# Patient Record
Sex: Male | Born: 1995 | Race: Black or African American | Hispanic: No | Marital: Single | Smoking: Current some day smoker
Health system: Southern US, Community
[De-identification: ages and names within clinical notes are randomized; demographics above are authoritative.]

## PROBLEM LIST (undated history)

## (undated) ENCOUNTER — Ambulatory Visit: Payer: Self-pay

## (undated) DIAGNOSIS — F341 Dysthymic disorder: Secondary | ICD-10-CM

## (undated) HISTORY — DX: Dysthymic disorder: F34.1

---
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
## 2017-12-22 ENCOUNTER — Ambulatory Visit (HOSPITAL_COMMUNITY)
Admission: EM | Admit: 2017-12-22 | Discharge: 2017-12-22 | Disposition: A | Discharge status: Home / Self Care | Payer: Medicaid Other | Attending: Family Medicine | Admitting: Family Medicine

## 2017-12-22 ENCOUNTER — Encounter (HOSPITAL_COMMUNITY): Payer: Self-pay | Admitting: Emergency Medicine

## 2017-12-22 ENCOUNTER — Other Ambulatory Visit: Payer: Self-pay

## 2017-12-22 DIAGNOSIS — Z113 Encounter for screening for infections with a predominantly sexual mode of transmission: Secondary | ICD-10-CM

## 2017-12-22 DIAGNOSIS — Z202 Contact with and (suspected) exposure to infections with a predominantly sexual mode of transmission: Secondary | ICD-10-CM | POA: Diagnosis not present

## 2017-12-22 DIAGNOSIS — R1033 Periumbilical pain: Secondary | ICD-10-CM

## 2017-12-22 DIAGNOSIS — R109 Unspecified abdominal pain: Secondary | ICD-10-CM | POA: Diagnosis not present

## 2017-12-22 DIAGNOSIS — F17210 Nicotine dependence, cigarettes, uncomplicated: Secondary | ICD-10-CM | POA: Insufficient documentation

## 2017-12-22 LAB — POCT URINALYSIS DIP (DEVICE)
Bilirubin Urine: NEGATIVE
GLUCOSE, UA: NEGATIVE mg/dL
Hgb urine dipstick: NEGATIVE
Ketones, ur: NEGATIVE mg/dL
Nitrite: NEGATIVE
PH: 7.5 (ref 5.0–8.0)
PROTEIN: NEGATIVE mg/dL
Specific Gravity, Urine: 1.015 (ref 1.005–1.030)
UROBILINOGEN UA: 2 mg/dL — AB (ref 0.0–1.0)

## 2017-12-22 MED ORDER — AZITHROMYCIN 250 MG PO TABS
1000.0000 mg | ORAL_TABLET | Freq: Once | ORAL | Status: AC
  Administered 2017-12-22: 1000 mg via ORAL

## 2017-12-22 MED ORDER — RANITIDINE HCL 150 MG PO TABS: 150.0000 mg | ORAL_TABLET | Freq: Two times a day (BID) | ORAL | 0 refills | Status: DC

## 2017-12-22 MED ORDER — AZITHROMYCIN 250 MG PO TABS
ORAL_TABLET | ORAL | Status: AC
Start: 2017-12-22 — End: ?
  Filled 2017-12-22: qty 4

## 2017-12-22 NOTE — ED Provider Notes (Signed)
[facility]    CSN: [id number] Arrival date & time: 12/22/17  1420     History   Chief Complaint Chief Complaint  Patient presents with  . Exposure to STD  . Abdominal Pain    HPI Douglas Mckinney is a 22 y.o. male presenting today for evaluation of abdominal pain and exposure to chlamydia.  Patient states that his partner told him to weeks ago that she tested positive for chlamydia.  Patient denies any symptoms, denies penile discharge, dysuria, increased frequency.  Denies any rashes or lesions.  Denies any scrotal swelling or testicular pain.  He also notes that he has been having some abdominal pain for the past 6 weeks.  States that will come and go, feels like a bubbling sensation and occasionally not feeling.  Notices this more nighttime and occasionally with eating.  States it feels like a hunger pain despite not being hungry.  Denies any nausea or vomiting.  Denies any chest discomfort or shortness of breath.  Does endorse some sour sensation in his mouth.   HPI  Past Medical History:  Diagnosis Date  . 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 Passive smoke exposure 05/07/2009  . Developmental delay 06/19/2003  . Allergic rhinitis 06/19/2003  . Behavior problems 03/16/2003    History reviewed. No pertinent surgical history.     Home Medications    Prior to Admission medications   Medication Sig Start Date End Date Taking? Authorizing Provider  ranitidine (ZANTAC) 150 MG tablet Take 1 tablet (150 mg total) by mouth 2 (two) times daily for 14 days. 12/22/17 01/05/18  Jovahn Breit, Junius Creamer, PA-C    Family History History reviewed. No pertinent family history.  Social History Social History   Tobacco Use  . Smoking status: Current Some Day Smoker  . Smokeless tobacco: Never Used  Substance Use Topics  . Alcohol use: No  . Drug use: Not Currently     Allergies   Patient  has no known allergies.   Review of Systems Review of Systems  Constitutional: Negative for fever.  HENT: Negative for sore throat.   Respiratory: Negative for shortness of breath.   Cardiovascular: Negative for chest pain.  Gastrointestinal: Positive for abdominal pain. Negative for nausea and vomiting.  Genitourinary: Negative for difficulty urinating, discharge, dysuria, frequency, penile pain, penile swelling, scrotal swelling and testicular pain.  Skin: Negative for rash.  Neurological: Negative for dizziness, light-headedness and headaches.     Physical Exam Triage Vital Signs ED Triage Vitals  Enc Vitals Group     BP 12/22/17 1439 118/78     Pulse Rate 12/22/17 1439 90     Resp --      Temp 12/22/17 1439 99.2 F (37.3 C)     Temp Source 12/22/17 1439 Oral     SpO2 12/22/17 1439 99 %     Weight --      Height --      Head Circumference --      Peak Flow --      Pain Score 12/22/17 1437 0     Pain Loc --      Pain Edu? --      Excl. in GC? --    No data found.  Updated Vital Signs BP 118/78 (BP Location: Left Arm)   Pulse 90   Temp 99.2 F (37.3 C) (Oral)   SpO2 99%  Visual Acuity Right Eye Distance:   Left Eye Distance:   Bilateral Distance:    Right Eye Near:   Left Eye Near:    Bilateral Near:     Physical Exam  Constitutional: He appears well-developed and well-nourished.  HENT:  Head: Normocephalic and atraumatic.  Eyes: Conjunctivae are normal.  Neck: Neck supple.  Cardiovascular: Normal rate and regular rhythm.  No murmur heard. Pulmonary/Chest: Effort normal and breath sounds normal. No respiratory distress.  Abdominal: Soft. There is no tenderness.  Nontender to light and deep palpation  Musculoskeletal: He exhibits no edema.  Neurological: He is alert.  Skin: Skin is warm and dry.  Psychiatric: He has a normal mood and affect.  Nursing note and vitals reviewed.    UC Treatments / Results  Labs (all labs ordered are listed, but  only abnormal results are displayed) Labs Reviewed  URINE CYTOLOGY ANCILLARY ONLY    EKG None Radiology No results found.  Procedures Procedures (including critical care time)  Medications Ordered in UC Medications  azithromycin (ZITHROMAX) tablet 1,000 mg (has no administration in time range)     Initial Impression / Assessment and Plan / UC Course  I have reviewed the triage vital signs and the nursing notes.  Pertinent labs & imaging results that were available during my care of the patient were reviewed by me and considered in my medical decision making (see chart for details).     Patient with exposure to chlamydia, will go ahead and empirically treat.  Urine cytology obtained.  Abdominal pain for 6 weeks, possibly related to chlamydia versus other true abdominal causes.  Abdominal exam unremarkable.  Possibly related to reflux.  We will do a trial of Zantac for 2 weeks.  Discussed strict return precautions. Patient verbalized understanding and is agreeable with plan.   Final Clinical Impressions(s) / UC Diagnoses   Final diagnoses:  Periumbilical abdominal pain  STD exposure    ED Discharge Orders        Ordered    ranitidine (ZANTAC) 150 MG tablet  2 times daily     12/22/17 1449       Controlled Substance Prescriptions [place] Controlled Substance Registry consulted? Not Applicable   Lew DawesWieters, Laird Runnion C, [place]PA-C 12/22/17 1458

## 2017-12-22 NOTE — Discharge Instructions (Signed)
We have treated you today for chlamydia, with azithromycin. Please refrain from sexual activity for 7 days while medicine is clearing infection.  We are testing you for Gonorrhea, Chlamydia and Trichomonas. We will call you if anything is positive and let you know if you require any further treatment. Please inform partner of any positive results.  Please return if symptoms not improving with treatment, development of fever, nausea, vomiting, abdominal pain, scrotal pain.  Please monitor to see if abdominal pain improves with treatment, may also try reflux medicine- zantac twice daily for 2 weeks.   Please return if abdominal pain worsening, developing new symptoms or becoming more frequent.

## 2017-12-22 NOTE — ED Triage Notes (Signed)
Pt reports a "knot feeling" in his stomach for the last six weeks.  He states it happens after he eats or at night.    Pt was also exposed to Chlamydia, he was told this two weeks ago.  Pt is showing no signs of infection at this time.

## 2017-12-23 ENCOUNTER — Telehealth (HOSPITAL_COMMUNITY): Payer: Self-pay

## 2017-12-23 LAB — URINE CYTOLOGY ANCILLARY ONLY
Chlamydia: POSITIVE — AB
Neisseria Gonorrhea: NEGATIVE
Trichomonas: NEGATIVE

## 2017-12-23 NOTE — Telephone Encounter (Signed)
Chlamydia is positive.  This was treated at the urgent care visit with po zithromax 1g. Attempted to contact pt no answer at this time. Need to educate pt to please refrain from sexual intercourse for 7 days to give the medicine time to work.  Sexual partners need to be notified and tested/treated.  Condoms may reduce risk of reinfection.  Recheck or followup with PCP for further evaluation if symptoms are not improving.  [facility] notified

## 2017-12-25 NOTE — Progress Notes (Signed)
Attempted to reach patient x 2 

## 2018-02-25 ENCOUNTER — Encounter (HOSPITAL_COMMUNITY): Payer: Self-pay | Admitting: Emergency Medicine

## 2018-02-25 ENCOUNTER — Ambulatory Visit (HOSPITAL_COMMUNITY)
Admission: EM | Admit: 2018-02-25 | Discharge: 2018-02-25 | Disposition: A | Discharge status: Home / Self Care | Payer: Medicaid Other | Attending: Family Medicine | Admitting: Family Medicine

## 2018-02-25 DIAGNOSIS — R112 Nausea with vomiting, unspecified: Secondary | ICD-10-CM | POA: Diagnosis not present

## 2018-02-25 DIAGNOSIS — R109 Unspecified abdominal pain: Secondary | ICD-10-CM | POA: Diagnosis present

## 2018-02-25 DIAGNOSIS — F17200 Nicotine dependence, unspecified, uncomplicated: Secondary | ICD-10-CM | POA: Insufficient documentation

## 2018-02-25 DIAGNOSIS — R1084 Generalized abdominal pain: Secondary | ICD-10-CM | POA: Diagnosis not present

## 2018-02-25 LAB — POCT URINALYSIS DIP (DEVICE)
Bilirubin Urine: NEGATIVE
Glucose, UA: NEGATIVE mg/dL
HGB URINE DIPSTICK: NEGATIVE
Ketones, ur: NEGATIVE mg/dL
Leukocytes, UA: NEGATIVE
NITRITE: NEGATIVE
PROTEIN: NEGATIVE mg/dL
Specific Gravity, Urine: 1.02 (ref 1.005–1.030)
UROBILINOGEN UA: 0.2 mg/dL (ref 0.0–1.0)
pH: 7.5 (ref 5.0–8.0)

## 2018-02-25 LAB — POCT I-STAT, CHEM 8
BUN: 13 mg/dL (ref 6–20)
CALCIUM ION: 1.22 mmol/L (ref 1.15–1.40)
CREATININE: 0.9 mg/dL (ref 0.61–1.24)
Chloride: 101 mmol/L (ref 98–111)
GLUCOSE: 96 mg/dL (ref 70–99)
HCT: 43 % (ref 39.0–52.0)
Hemoglobin: 14.6 g/dL (ref 13.0–17.0)
Potassium: 4.8 mmol/L (ref 3.5–5.1)
Sodium: 141 mmol/L (ref 135–145)
TCO2: 29 mmol/L (ref 22–32)

## 2018-02-25 MED ORDER — OMEPRAZOLE 20 MG PO CPDR: 20.0000 mg | DELAYED_RELEASE_CAPSULE | Freq: Every day | ORAL | 0 refills | Status: DC

## 2018-02-25 NOTE — Discharge Instructions (Addendum)
Blood and urine testing are negative Continue to eat a well-balanced diet Drink plenty of water.  Limit sweet drinks. Pay attention to what foods are causing your symptoms. I have recommended follow-up with a gastroenterologist for the abdominal pain. Take omeprazole once a day.  This will help reduce stomach acid and heartburn We did lab testing during this visit.  If there are any abnormal findings that require change in medicine or indicate a positive result, you will be notified.  If all of your tests are normal, you will not be called.

## 2018-02-25 NOTE — ED Provider Notes (Signed)
[facility]    CSN: [id number] Arrival date & time: 02/25/18  1233     History   Chief Complaint Chief Complaint  Patient presents with  . Abdominal Pain    HPI Douglas Mckinney is a 22 y.o. male.   HPI\ Here complaining of abdominal pain since March.  He has abdominal discomfort daily.  Usually at [facility] crampy feeling in the middle of his belly around his umbilicus.  He states that he feels a gnawing pain like he is hungry, even when he is eaten.  Little nausea.  He vomited 2 days ago.  No constipation or diarrhea.  No blood in bowels.  No prior abdominal problems such as ulcers or GERD.  No problems when he was a child.  He does not smoke cigarettes, drink alcohol, or take excessive anti-inflammatory medications.  He had a trial of 2 weeks of Zantac which did not help him.  He cannot tell whether certain foods elicit this pain.  Spicy food does seem to make him somewhat worse. He has a history of coming here in April for similar complaints.  At that time he had exposure to chlamydia.  He was treated for the chlamydia with azithromycin, he was given the 2 weeks of Zantac, and told to come back as needed.  He was sent his chlamydia test results, but states he never received them. He has urinary frequency.  No dysuria or discharge.  No suprapubic pain.  No fever chills.  Uncertain whether he might have chlamydia again.  Vague about sexual encounters. (in private) He is here today with his girlfriend who is demanding answers.  She wants to know his test results.  The girlfriend has brought her mother.  I did speak with patient confidentially and he does not want his chlamydia results discussed with them.  Past Medical History:  Diagnosis Date  . 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 Passive smoke exposure 05/07/2009  . Developmental delay 06/19/2003  . Allergic rhinitis 06/19/2003  .  Behavior problems 03/16/2003    History reviewed. No pertinent surgical history.     Home Medications    Prior to Admission medications   Medication Sig Start Date End Date Taking? Authorizing Provider  omeprazole (PRILOSEC) 20 MG capsule Take 1 capsule (20 mg total) by mouth daily. 02/25/18   Eustace Moore, MD    Family History No family history on file.  Social History Social History   Tobacco Use  . Smoking status: Current Some Day Smoker  . Smokeless tobacco: Never Used  Substance Use Topics  . Alcohol use: No  . Drug use: Not Currently     Allergies   Patient has no known allergies.   Review of Systems Review of Systems  Constitutional: Negative for appetite change, chills, diaphoresis, fever and unexpected weight change.  HENT: Negative for ear pain and sore throat.   Eyes: Positive for redness. Negative for pain and visual disturbance.  Respiratory: Negative for cough and shortness of breath.   Cardiovascular: Negative for chest pain and palpitations.  Gastrointestinal: Positive for abdominal distention, abdominal pain, nausea and vomiting. Negative for constipation and diarrhea.       Rare, this week  Genitourinary: Positive for frequency. Negative for discharge, dysuria, genital sores, hematuria and penile pain.  Musculoskeletal: Negative for arthralgias and back pain.  Skin: Negative for color change and rash.  Neurological: Negative for seizures and syncope.  Psychiatric/Behavioral: Negative for behavioral problems.  All other systems reviewed and are negative.    Physical Exam Triage Vital Signs ED Triage Vitals  Enc Vitals Group     BP 02/25/18 1251 100/63     Pulse Rate 02/25/18 1249 (!) 55     Resp 02/25/18 1249 16     Temp 02/25/18 1249 98.2 F (36.8 C)     Temp src --      SpO2 02/25/18 1249 100 %     Weight --      Height --      Head Circumference --      Peak Flow --      Pain Score --      Pain Loc --      Pain Edu? --       Excl. in GC? --    No data found.  Updated Vital Signs BP 100/63   Pulse (!) 55   Temp 98.2 F (36.8 C)   Resp 16   SpO2 100%       Physical Exam  Constitutional: He appears well-developed and well-nourished.  Non-toxic appearance. He does not appear ill. No distress.  HENT:  Head: Normocephalic and atraumatic.  Mouth/Throat: Oropharynx is clear and moist. No oropharyngeal exudate.  Eyes: Pupils are equal, round, and reactive to light. Conjunctivae and EOM are normal.  Neck: Normal range of motion.  Cardiovascular: Normal rate, regular rhythm and normal heart sounds.  Pulmonary/Chest: Effort normal and breath sounds normal. No respiratory distress. He has no rales.  Abdominal: Soft. Normal appearance and bowel sounds are normal. He exhibits no distension. There is no hepatosplenomegaly. There is no tenderness. There is no rebound, no guarding and no CVA tenderness. No hernia.  Musculoskeletal: Normal range of motion. He exhibits no edema.  Neurological: He is alert.  Skin: Skin is warm and dry.  Psychiatric: He has a normal mood and affect. His behavior is normal.     UC Treatments / Results  Labs (all labs ordered are listed, but only abnormal results are displayed) Labs Reviewed  POCT I-STAT, CHEM 8  POCT URINALYSIS DIP (DEVICE)  URINE CYTOLOGY ANCILLARY ONLY    EKG None  Radiology No results found.  Procedures Procedures (including critical care time)  Medications Ordered in UC Medications - No data to display  Initial Impression / Assessment and Plan / UC Course  I have reviewed the triage vital signs and the nursing notes.  Pertinent labs & imaging results that were available during my care of the patient were reviewed by me and considered in my medical decision making (see chart for details).    Discussed chronic abdominal complaints.  This is beyond the scope of an urgent care center.  We can do acute work-up today to make sure he does not have any  infectious process or serious illness.  He is to follow-up with a primary care doctor or gastroenterologist to figure out why he has had a month-long abdominal pain.  Discussed this could be ulcers, GERD, irritable bowel syndrome, laboratory bowel disease, food intolerance, food allergy, gluten disorder. Final Clinical Impressions(s) / UC Diagnoses   Final diagnoses:  Generalized abdominal pain  Nausea and vomiting, intractability of vomiting not specified, unspecified vomiting type     Discharge Instructions     Blood and urine testing are negative Continue to eat a well-balanced diet Drink plenty of water.  Limit sweet drinks. Pay attention to what  foods are causing your symptoms. I have recommended follow-up with a gastroenterologist for the abdominal pain. Take omeprazole once a day.  This will help reduce stomach acid and heartburn We did lab testing during this visit.  If there are any abnormal findings that require change in medicine or indicate a positive result, you will be notified.  If all of your tests are normal, you will not be called.       ED Prescriptions    Medication Sig Dispense Auth. Provider   omeprazole (PRILOSEC) 20 MG capsule Take 1 capsule (20 mg total) by mouth daily. 30 capsule Eustace MooreNelson, Joylene Wescott Sue, MD     Controlled Substance Prescriptions [place] Controlled Substance Registry consulted? Not Applicable   Eustace MooreNelson, Pius Byrom Sue, MD 02/25/18 1510

## 2018-02-25 NOTE — ED Triage Notes (Signed)
Pt states hes had stomach pains, vomiting and diarrhea for "months".

## 2018-02-28 LAB — URINE CYTOLOGY ANCILLARY ONLY
CHLAMYDIA, DNA PROBE: NEGATIVE
NEISSERIA GONORRHEA: NEGATIVE
TRICH (WINDOWPATH): NEGATIVE

## 2018-03-29 ENCOUNTER — Encounter (HOSPITAL_COMMUNITY): Payer: Self-pay

## 2018-03-29 ENCOUNTER — Ambulatory Visit (HOSPITAL_COMMUNITY)
Admission: EM | Admit: 2018-03-29 | Discharge: 2018-03-29 | Disposition: A | Discharge status: Home / Self Care | Payer: Medicaid Other | Attending: Internal Medicine | Admitting: Internal Medicine

## 2018-03-29 DIAGNOSIS — R109 Unspecified abdominal pain: Secondary | ICD-10-CM | POA: Diagnosis not present

## 2018-03-29 DIAGNOSIS — R110 Nausea: Secondary | ICD-10-CM | POA: Diagnosis not present

## 2018-03-29 DIAGNOSIS — R1084 Generalized abdominal pain: Secondary | ICD-10-CM | POA: Diagnosis not present

## 2018-03-29 DIAGNOSIS — K5909 Other constipation: Secondary | ICD-10-CM

## 2018-03-29 DIAGNOSIS — Z202 Contact with and (suspected) exposure to infections with a predominantly sexual mode of transmission: Secondary | ICD-10-CM | POA: Diagnosis not present

## 2018-03-29 DIAGNOSIS — Z113 Encounter for screening for infections with a predominantly sexual mode of transmission: Secondary | ICD-10-CM | POA: Diagnosis not present

## 2018-03-29 DIAGNOSIS — R1033 Periumbilical pain: Secondary | ICD-10-CM | POA: Diagnosis not present

## 2018-03-29 DIAGNOSIS — Z7251 High risk heterosexual behavior: Secondary | ICD-10-CM

## 2018-03-29 DIAGNOSIS — K5900 Constipation, unspecified: Secondary | ICD-10-CM | POA: Diagnosis present

## 2018-03-29 MED ORDER — LIDOCAINE HCL (PF) 1 % IJ SOLN
INTRAMUSCULAR | Status: AC
Start: 2018-03-29 — End: 2018-03-29
  Filled 2018-03-29: qty 2

## 2018-03-29 MED ORDER — AZITHROMYCIN 250 MG PO TABS
1000.0000 mg | ORAL_TABLET | Freq: Once | ORAL | Status: AC
  Administered 2018-03-29: 1000 mg via ORAL

## 2018-03-29 MED ORDER — CEFTRIAXONE SODIUM 250 MG IJ SOLR
250.0000 mg | Freq: Once | INTRAMUSCULAR | Status: AC
  Administered 2018-03-29: 250 mg via INTRAMUSCULAR

## 2018-03-29 MED ORDER — AZITHROMYCIN 250 MG PO TABS
ORAL_TABLET | ORAL | Status: AC
  Filled 2018-03-29: qty 4

## 2018-03-29 MED ORDER — DICYCLOMINE HCL 20 MG PO TABS: 20.0000 mg | ORAL_TABLET | Freq: Two times a day (BID) | ORAL | 0 refills | Status: DC

## 2018-03-29 MED ORDER — ONDANSETRON HCL 4 MG PO TABS: 4.0000 mg | ORAL_TABLET | Freq: Four times a day (QID) | ORAL | 0 refills | Status: DC

## 2018-03-29 MED ORDER — CEFTRIAXONE SODIUM 250 MG IJ SOLR
INTRAMUSCULAR | Status: AC
Start: 2018-03-29 — End: 2018-03-29
  Filled 2018-03-29: qty 250

## 2018-03-29 NOTE — Discharge Instructions (Addendum)
Bentyl prescribed.  Take as directed Continue taking omeprazole as prescribed.   Zofran prescribed.  Take as needed for nausea.   Avoid eating 2-3 hours before bed Elevate head of bed.  Avoid chocolate, caffeine, alcohol, onion, and mint prior to bed.  This relaxes the bottom part of your esophagus and can make your symptoms worse.   Given rocephin 250mg  injection and azithromycin 1g in office Urine cytology obtained HIV/ syphilis testing today We will follow up with you regarding the results of your test If tests are positive, please abstain from sexual activity for at least 7 days and notify partners  Primary care provider assistance initiated to establish care  Follow up with PCP if symptoms persists Return here or go to ER if you have any new or worsening symptoms

## 2018-03-29 NOTE — ED Provider Notes (Signed)
[facility]   [id number] 03/29/18 Arrival Time: 1212  SUBJECTIVE:  Douglas Mckinney is a 22 y.o. male who presents with complaint of abdominal discomfort that began 5-6 months ago.  Denies a precipitating event, or trauma.  Localizes pain to periumbilical region.  Describes as worsening, constant, and sharp in character.  Has tried zantac and omeprazole without relief.  Symptoms are made worse with eating chocolate or sugary foods.  Symptoms improved with smoking marijuana.  Denies similar symptoms in the past.  Last BM this morning with looser stools. Complains of associated nausea, constipation and then BM's with looser stools.  Denies fever, chills, appetite changes, weight changes, vomiting, chest pain, SOB, hematochezia, melena, dysuria, difficulty urinating, increased frequency or urgency, flank pain, loss of bowel or bladder function.  Patient also requests STD testing today.  Currently asymptomatic.  Last unprotected sex 3 weeks ago.  Sexually active with 2 male partners. Partners without symptoms.  Denies testicular pain, testicular swelling, penile discharge, rash or lesions.    No LMP for male patient.  ROS: As per HPI.  Past Medical History:  Diagnosis Date  . 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 No pertinent surgical history. No Known Allergies No current facility-administered medications on file prior to encounter.    Current Outpatient Medications on File Prior to Encounter  Medication Sig Dispense Refill  . omeprazole (PRILOSEC) 20 MG capsule Take 1 capsule (20 mg total) by mouth daily. 30 capsule 0   Social History   Socioeconomic History  . Marital status: Single    Spouse name: Not on file  . Number of children: Not on file  . Years of education: Not on file  . Highest education level: Not on file  Occupational History  . Not on file  Social Needs  . Financial resource strain: Not on file  . Food  insecurity:    Worry: Not on file    Inability: Not on file  . Transportation needs:    Medical: Not on file    Non-medical: Not on file  Tobacco Use  . Smoking status: Current Some Day Smoker  . Smokeless tobacco: Never Used  Substance and Sexual Activity  . Alcohol use: No  . Drug use: Not Currently  . Sexual activity: Yes    Birth control/protection: None  Lifestyle  . Physical activity:    Days per week: Not on file    Minutes per session: Not on file  . Stress: Not on file  Relationships  . Social connections:    Talks on phone: Not on file    Gets together: Not on file    Attends religious service: Not on file    Active member of club or organization: Not on file    Attends meetings of clubs or organizations: Not on file    Relationship status: Not on file  . Intimate partner violence:    Fear of current or ex partner: Not on file    Emotionally abused: Not on file    Physically abused: Not on file    Forced sexual activity: Not on file  Other Topics Concern  . Not on file  Social History Narrative  . Not on file   Family History  Problem Relation Age of Onset  . Healthy Mother   . Healthy Father      OBJECTIVE:  Vitals:   03/29/18 1222  BP: 102/60  Pulse: 60  Resp: 20  Temp:  98.1 F (36.7 C)  TempSrc: Oral  SpO2: 98%    General appearance: AOx3 in no acute distress HEENT: NCAT.  Oropharynx clear.  Lungs: clear to auscultation bilaterally without adventitious breath sounds Heart: regular rate and rhythm.  Radial pulses 2+ symmetrical bilaterally Abdomen: soft, non-distended; hyperactive bowel sounds; non-tender to light and deep palpation; nontender at McBurney's point; negative Murphy's sign; no guarding; unable to reproduce pain on exam GU: Deferred Back: no CVA tenderness Extremities: no edema; symmetrical with no gross deformities Skin: warm and dry Neurologic: normal gait Psychological: alert and cooperative; normal mood and  affect  ASSESSMENT & PLAN:  1. Generalized abdominal discomfort   2. Unprotected sex     Meds ordered this encounter  Medications  . azithromycin (ZITHROMAX) tablet 1,000 mg  . cefTRIAXone (ROCEPHIN) injection 250 mg  . dicyclomine (BENTYL) 20 MG tablet    Sig: Take 1 tablet (20 mg total) by mouth 2 (two) times daily.    Dispense:  20 tablet    Refill:  0    Order Specific Question:   Supervising Provider    Answer:   Isa Rankin [phone]  . ondansetron (ZOFRAN) 4 MG tablet    Sig: Take 1 tablet (4 mg total) by mouth every 6 (six) hours.    Dispense:  12 tablet    Refill:  0    Order Specific Question:   Supervising Provider    Answer:   Isa Rankin [[id number]]    Unresulted Labs (From admission, onward)   Start     Ordered   03/29/18 1254  HIV antibody  Once,   STAT     03/29/18 1253   03/29/18 1254  RPR  STAT,   STAT     03/29/18 1253     Bentyl prescribed.  Take as directed Continue taking omeprazole as prescribed.   Zofran prescribed.  Take as needed for nausea.   Avoid eating 2-3 hours before bed Elevate head of bed.  Avoid chocolate, caffeine, alcohol, onion, and mint prior to bed.  This relaxes the bottom part of your esophagus and can make your symptoms worse.   Given rocephin 250mg  injection and azithromycin 1g in office Urine cytology obtained HIV/ syphilis testing today We will follow up with you regarding the results of your test If tests are positive, please abstain from sexual activity for at least 7 days and notify partners  Primary care provider assistance initiated to establish care  Follow up with PCP if symptoms persists Return here or go to ER if you have any new or worsening symptoms     Reviewed expectations re: course of current medical issues. Questions answered. Outlined signs and symptoms indicating need for more acute intervention. Patient verbalized understanding. After Visit Summary given.   Rennis Harding,  PA-C 03/29/18 1323

## 2018-03-29 NOTE — ED Triage Notes (Signed)
Pt presents for STD testing and issues with acid reflux

## 2018-03-30 LAB — URINE CYTOLOGY ANCILLARY ONLY
CHLAMYDIA, DNA PROBE: NEGATIVE
NEISSERIA GONORRHEA: NEGATIVE
TRICH (WINDOWPATH): NEGATIVE

## 2018-03-30 LAB — HIV ANTIBODY (ROUTINE TESTING W REFLEX): HIV SCREEN 4TH GENERATION: NONREACTIVE

## 2018-03-30 LAB — RPR: RPR Ser Ql: NONREACTIVE

## 2018-04-16 ENCOUNTER — Other Ambulatory Visit: Payer: Self-pay | Admitting: Family Medicine

## 2018-04-19 ENCOUNTER — Encounter (HOSPITAL_COMMUNITY): Payer: Self-pay

## 2018-04-19 ENCOUNTER — Emergency Department (HOSPITAL_COMMUNITY)
Admission: EM | Admit: 2018-04-19 | Discharge: 2018-04-19 | Disposition: A | Discharge status: Home / Self Care | Payer: Medicaid Other | Attending: Emergency Medicine | Admitting: Emergency Medicine

## 2018-04-19 ENCOUNTER — Other Ambulatory Visit: Payer: Self-pay

## 2018-04-19 DIAGNOSIS — Z79899 Other long term (current) drug therapy: Secondary | ICD-10-CM | POA: Diagnosis not present

## 2018-04-19 DIAGNOSIS — F17210 Nicotine dependence, cigarettes, uncomplicated: Secondary | ICD-10-CM | POA: Diagnosis not present

## 2018-04-19 DIAGNOSIS — R1084 Generalized abdominal pain: Secondary | ICD-10-CM

## 2018-04-19 DIAGNOSIS — R109 Unspecified abdominal pain: Secondary | ICD-10-CM | POA: Diagnosis present

## 2018-04-19 LAB — CBC
HCT: 42.5 % (ref 39.0–52.0)
Hemoglobin: 14.4 g/dL (ref 13.0–17.0)
MCH: 31.5 pg (ref 26.0–34.0)
MCHC: 33.9 g/dL (ref 30.0–36.0)
MCV: 93 fL (ref 78.0–100.0)
Platelets: 205 10*3/uL (ref 150–400)
RBC: 4.57 MIL/uL (ref 4.22–5.81)
RDW: 11.9 % (ref 11.5–15.5)
WBC: 4.6 10*3/uL (ref 4.0–10.5)

## 2018-04-19 LAB — COMPREHENSIVE METABOLIC PANEL
ALBUMIN: 4.7 g/dL (ref 3.5–5.0)
ALK PHOS: 52 U/L (ref 38–126)
ALT: 18 U/L (ref 0–44)
AST: 25 U/L (ref 15–41)
Anion gap: 9 (ref 5–15)
BILIRUBIN TOTAL: 2 mg/dL — AB (ref 0.3–1.2)
BUN: 17 mg/dL (ref 6–20)
CO2: 27 mmol/L (ref 22–32)
CREATININE: 0.88 mg/dL (ref 0.61–1.24)
Calcium: 10.1 mg/dL (ref 8.9–10.3)
Chloride: 104 mmol/L (ref 98–111)
GFR calc Af Amer: >60 mL/min (ref >60)
GFR calc non Af Amer: >60 mL/min (ref >60)
GLUCOSE: 88 mg/dL (ref 70–99)
Potassium: 3.9 mmol/L (ref 3.5–5.1)
Sodium: 140 mmol/L (ref 135–145)
TOTAL PROTEIN: 7.4 g/dL (ref 6.5–8.1)

## 2018-04-19 LAB — LIPASE, BLOOD: Lipase: 37 U/L (ref 11–51)

## 2018-04-19 MED ORDER — KETOROLAC TROMETHAMINE 30 MG/ML IJ SOLN
30.0000 mg | Freq: Once | INTRAMUSCULAR | Status: AC
  Administered 2018-04-19: 30 mg via INTRAMUSCULAR
  Filled 2018-04-19: qty 1

## 2018-04-19 MED ORDER — PSYLLIUM 58.6 % PO POWD: 1.0000 | Freq: Three times a day (TID) | ORAL | 12 refills | Status: DC

## 2018-04-19 NOTE — ED Triage Notes (Signed)
Pt presents for evaluation of abd pain x 6 months with worsening x 1 month. States he was told it was acid reflux but pain is severe and medication has not helped. States some diarrhea and nausea. No vomiting.

## 2018-04-19 NOTE — ED Provider Notes (Signed)
[facility] Provider Note   CSN: [id number] Arrival date & time: 04/19/18  1228     History   Chief Complaint Chief Complaint  Patient presents with  . Abdominal Pain    HPI Douglas Mckinney is a 22 y.o. male.  HPI   22 year old male presents today with complaints of abdominal pain.  Patient has a history of chronic abdominal pain over the last year.  Patient notes that this is generalized but mostly feels it in his periumbilical region.  He notes that he wakes up in the morning is slightly nauseous has growling in his stomach and a burning sensation.  Patient notes that he has an episode of diarrhea and then continues on with his day.  Patient notes that he has no vomiting, fever, or worsening of his symptoms.  He has been seen several times at urgent care and has taken Bentyl, omeprazole without significant improvement in his symptoms.  Patient denies any dark or black stools.  He has been told he needs a primary care provider in order to have gastroenterology follow-up but has not attempted to get a primary care provider.  Patient notes that he only eats fast food as he works at [employer].  Past Medical History:  Diagnosis Date  . 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 Passive smoke exposure 05/07/2009  . Developmental delay 06/19/2003  . Allergic rhinitis 06/19/2003  . Behavior problems 03/16/2003    History reviewed. No pertinent surgical history.      Home Medications    Prior to Admission medications   Medication Sig Start Date End Date Taking? Authorizing Provider  dicyclomine (BENTYL) 20 MG tablet Take 1 tablet (20 mg total) by mouth 2 (two) times daily. 03/29/18   Wurst, [place], PA-C  omeprazole (PRILOSEC) 20 MG capsule Take 1 capsule (20 mg total) by mouth daily. 02/25/18   Eustace MooreNelson, Yvonne Sue, MD  ondansetron (ZOFRAN) 4 MG tablet Take 1 tablet  (4 mg total) by mouth every 6 (six) hours. 03/29/18   Wurst, [place], PA-C  psyllium (METAMUCIL SMOOTH TEXTURE) 58.6 % powder Take 1 packet by mouth 3 (three) times daily. 04/19/18   Eyvonne MechanicHedges, Court Gracia, PA-C    Family History Family History  Problem Relation Age of Onset  . Healthy Mother   . Healthy Father     Social History Social History   Tobacco Use  . Smoking status: Current Some Day Smoker  . Smokeless tobacco: Never Used  Substance Use Topics  . Alcohol use: No  . Drug use: Not Currently     Allergies   Patient has no known allergies.   Review of Systems Review of Systems  All other systems reviewed and are negative.    Physical Exam Updated Vital Signs BP 125/67 (BP Location: Right Arm)   Pulse (!) 54   Temp 98.3 F (36.8 C) (Oral)   Resp 12   SpO2 98%   Physical Exam  Constitutional: He is oriented to person, place, and time. He appears well-developed and well-nourished.  HENT:  Head: Normocephalic and atraumatic.  Eyes: Pupils are equal, round, and reactive to light. Conjunctivae are normal. Right eye exhibits no discharge. Left eye exhibits no discharge. No scleral icterus.  Neck: Normal range of motion. No JVD present. No tracheal deviation present.  Pulmonary/Chest: Effort normal. No stridor.  Abdominal:  Minor tenderness to palpation of the periumbilical  region, nonfocal, nonsevere no rebound or guarding, bowel sounds normal  Neurological: He is alert and oriented to person, place, and time. Coordination normal.  Psychiatric: He has a normal mood and affect. His behavior is normal. Judgment and thought content normal.  Nursing note and vitals reviewed.   ED Treatments / Results  Labs (all labs ordered are listed, but only abnormal results are displayed) Labs Reviewed  COMPREHENSIVE METABOLIC PANEL - Abnormal; Notable for the following components:      Result Value   Total Bilirubin 2.0 (*)    All other components within normal limits  LIPASE,  BLOOD  CBC    EKG None  Radiology No results found.  Procedures Procedures (including critical care time)  Medications Ordered in ED Medications  ketorolac (TORADOL) 30 MG/ML injection 30 mg (30 mg Intramuscular Given 04/19/18 1447)     Initial Impression / Assessment and Plan / ED Course  I have reviewed the triage vital signs and the nursing notes.  Pertinent labs & imaging results that were available during my care of the patient were reviewed by me and considered in my medical decision making (see chart for details).     Labs: Lipase, CMP, CBC-elevated bilirubin  Imaging:  Consults:  Therapeutics: Toradol  Discharge Meds: Metamucil  Assessment/Plan: 22 year old male presents today with complaints of chronic abdominal pain.  Patient has a very poor diet as he only eats fast food, he has diarrhea that is nonbloody with low suspicion for infectious etiology.  Patient has no worsening of symptoms, this is chronic in nature low suspicion for acute intra-abdominal pathology.  Question irritable bowel.  I extensively counseled patient on dietary changes, he will be started on Metamucil, he will follow-up as an outpatient with gastroenterology.  He is given strict return precautions, he verbalized understanding and agreement to today's plan had no further questions or concerns at the time discharge.    Final Clinical Impressions(s) / ED Diagnoses   Final diagnoses:  Generalized abdominal pain    ED Discharge Orders         Ordered    psyllium (METAMUCIL SMOOTH TEXTURE) 58.6 % powder  3 times daily     04/19/18 1442           HedgesTinnie Gens, Shareef Eddinger, PA-C 04/19/18 1508    Virgina Norfolkuratolo, Adam, DO 04/19/18 2206

## 2018-04-19 NOTE — Discharge Instructions (Signed)
Please read attached information. If you experience any new or worsening signs or symptoms please return to the emergency room for evaluation. Please follow-up with your primary care provider or specialist as discussed. Please use medication prescribed only as directed and discontinue taking if you have any concerning signs or symptoms.   °

## 2018-08-17 ENCOUNTER — Emergency Department (HOSPITAL_COMMUNITY): Payer: Medicaid Other

## 2018-08-17 ENCOUNTER — Emergency Department (HOSPITAL_COMMUNITY)
Admission: EM | Admit: 2018-08-17 | Discharge: 2018-08-17 | Disposition: A | Discharge status: Home / Self Care | Payer: Medicaid Other | Attending: Emergency Medicine | Admitting: Emergency Medicine

## 2018-08-17 DIAGNOSIS — F1290 Cannabis use, unspecified, uncomplicated: Secondary | ICD-10-CM | POA: Insufficient documentation

## 2018-08-17 DIAGNOSIS — K529 Noninfective gastroenteritis and colitis, unspecified: Secondary | ICD-10-CM

## 2018-08-17 DIAGNOSIS — R1011 Right upper quadrant pain: Secondary | ICD-10-CM | POA: Diagnosis not present

## 2018-08-17 DIAGNOSIS — F17200 Nicotine dependence, unspecified, uncomplicated: Secondary | ICD-10-CM | POA: Diagnosis not present

## 2018-08-17 DIAGNOSIS — R112 Nausea with vomiting, unspecified: Secondary | ICD-10-CM | POA: Diagnosis not present

## 2018-08-17 DIAGNOSIS — R1030 Lower abdominal pain, unspecified: Secondary | ICD-10-CM | POA: Diagnosis not present

## 2018-08-17 LAB — CBC
HCT: 42.2 % (ref 39.0–52.0)
Hemoglobin: 14.3 g/dL (ref 13.0–17.0)
MCH: 31.4 pg (ref 26.0–34.0)
MCHC: 33.9 g/dL (ref 30.0–36.0)
MCV: 92.5 fL (ref 80.0–100.0)
NRBC: 0 % (ref 0.0–0.2)
PLATELETS: 229 10*3/uL (ref 150–400)
RBC: 4.56 MIL/uL (ref 4.22–5.81)
RDW: 11.4 % — ABNORMAL LOW (ref 11.5–15.5)
WBC: 20 10*3/uL — ABNORMAL HIGH (ref 4.0–10.5)

## 2018-08-17 LAB — COMPREHENSIVE METABOLIC PANEL
ALBUMIN: 5.1 g/dL — AB (ref 3.5–5.0)
ALK PHOS: 52 U/L (ref 38–126)
ALT: 19 U/L (ref 0–44)
AST: 32 U/L (ref 15–41)
Anion gap: 17 — ABNORMAL HIGH (ref 5–15)
BUN: 14 mg/dL (ref 6–20)
CALCIUM: 10.5 mg/dL — AB (ref 8.9–10.3)
CO2: 21 mmol/L — AB (ref 22–32)
Chloride: 101 mmol/L (ref 98–111)
Creatinine, Ser: 1.12 mg/dL (ref 0.61–1.24)
GFR calc non Af Amer: >60 mL/min (ref >60)
GLUCOSE: 167 mg/dL — AB (ref 70–99)
POTASSIUM: 3.3 mmol/L — AB (ref 3.5–5.1)
SODIUM: 139 mmol/L (ref 135–145)
Total Bilirubin: 2.7 mg/dL — ABNORMAL HIGH (ref 0.3–1.2)
Total Protein: 7.8 g/dL (ref 6.5–8.1)

## 2018-08-17 LAB — URINALYSIS, ROUTINE W REFLEX MICROSCOPIC
Bacteria, UA: NONE SEEN
Bilirubin Urine: NEGATIVE
GLUCOSE, UA: NEGATIVE mg/dL
Hgb urine dipstick: NEGATIVE
KETONES UR: 20 mg/dL — AB
Leukocytes, UA: NEGATIVE
Nitrite: NEGATIVE
PH: 9 — AB (ref 5.0–8.0)
PROTEIN: 30 mg/dL — AB
Specific Gravity, Urine: 1.02 (ref 1.005–1.030)

## 2018-08-17 LAB — RAPID URINE DRUG SCREEN, HOSP PERFORMED
Amphetamines: NOT DETECTED
BARBITURATES: NOT DETECTED
Benzodiazepines: NOT DETECTED
Cocaine: NOT DETECTED
Opiates: NOT DETECTED
Tetrahydrocannabinol: POSITIVE — AB

## 2018-08-17 LAB — LIPASE, BLOOD: LIPASE: 22 U/L (ref 11–51)

## 2018-08-17 MED ORDER — KETOROLAC TROMETHAMINE 30 MG/ML IJ SOLN
30.0000 mg | Freq: Once | INTRAMUSCULAR | Status: AC
  Administered 2018-08-17: 30 mg via INTRAVENOUS
  Filled 2018-08-17: qty 1

## 2018-08-17 MED ORDER — LIDOCAINE VISCOUS HCL 2 % MT SOLN: 15.0000 mL | Freq: Once | OROMUCOSAL | Status: DC

## 2018-08-17 MED ORDER — LIDOCAINE VISCOUS HCL 2 % MT SOLN
15.0000 mL | Freq: Once | OROMUCOSAL | Status: AC
  Administered 2018-08-17: 15 mL via ORAL
  Filled 2018-08-17: qty 15

## 2018-08-17 MED ORDER — HALOPERIDOL LACTATE 5 MG/ML IJ SOLN
2.0000 mg | Freq: Once | INTRAMUSCULAR | Status: AC
  Administered 2018-08-17: 2 mg via INTRAVENOUS
  Filled 2018-08-17: qty 1

## 2018-08-17 MED ORDER — ONDANSETRON HCL 4 MG/2ML IJ SOLN: 4.0000 mg | Freq: Once | INTRAMUSCULAR | Status: DC

## 2018-08-17 MED ORDER — SODIUM CHLORIDE 0.9 % IV BOLUS
1000.0000 mL | Freq: Once | INTRAVENOUS | Status: AC
  Administered 2018-08-17: 1000 mL via INTRAVENOUS

## 2018-08-17 MED ORDER — ONDANSETRON 4 MG PO TBDP: 8.0000 mg | ORAL_TABLET | Freq: Once | ORAL | Status: DC | PRN

## 2018-08-17 MED ORDER — FAMOTIDINE IN NACL 20-0.9 MG/50ML-% IV SOLN
20.0000 mg | INTRAVENOUS | Status: AC
  Administered 2018-08-17: 20 mg via INTRAVENOUS
  Filled 2018-08-17: qty 50

## 2018-08-17 MED ORDER — ONDANSETRON 4 MG PO TBDP
8.0000 mg | ORAL_TABLET | Freq: Once | ORAL | Status: AC
  Administered 2018-08-17: 8 mg via ORAL
  Filled 2018-08-17: qty 2

## 2018-08-17 MED ORDER — ONDANSETRON 4 MG PO TBDP: 4.0000 mg | ORAL_TABLET | Freq: Three times a day (TID) | ORAL | 0 refills | Status: DC | PRN

## 2018-08-17 MED ORDER — ALUM & MAG HYDROXIDE-SIMETH 200-200-20 MG/5ML PO SUSP: 30.0000 mL | Freq: Once | ORAL | Status: DC

## 2018-08-17 MED ORDER — METOCLOPRAMIDE HCL 5 MG/ML IJ SOLN
10.0000 mg | INTRAMUSCULAR | Status: AC
  Administered 2018-08-17: 10 mg via INTRAVENOUS
  Filled 2018-08-17: qty 2

## 2018-08-17 MED ORDER — SUCRALFATE 1 GM/10ML PO SUSP: 1.0000 g | Freq: Three times a day (TID) | ORAL | 0 refills | Status: DC

## 2018-08-17 MED ORDER — ALUM & MAG HYDROXIDE-SIMETH 200-200-20 MG/5ML PO SUSP
30.0000 mL | Freq: Once | ORAL | Status: AC
  Administered 2018-08-17: 30 mL via ORAL
  Filled 2018-08-17: qty 30

## 2018-08-17 MED ORDER — DICYCLOMINE HCL 10 MG/ML IM SOLN
20.0000 mg | Freq: Once | INTRAMUSCULAR | Status: AC
  Administered 2018-08-17: 20 mg via INTRAMUSCULAR
  Filled 2018-08-17: qty 2

## 2018-08-17 NOTE — ED Notes (Signed)
Pt able to drink fluids and keep it down , no n/v , he did state that it hurt when he drank

## 2018-08-17 NOTE — ED Provider Notes (Signed)
7:53 AM Pt re-evaluated. He is now resting comfortably, but continues to feel nauseated. He was able to drink gingerale about 1 hour ago without any further vomiting. However, he reported that it "burned" with swallowing. Most likey pt has esophagitis from intractable vomiting. Gi cocktail has been ordered for this.  - RUQ US is unremarkable. No CBD dilatation or gallstones. Isolated T bili elevation, likely Gilberts. Transaminases wnl. - KUb also unremarkable. - Suspect viral gastroenteritis/food born illness after eating [employer]. Leukocytosis likely 2/2 to this.  - Will discharge to home with PRN zofran as well as 2 days of carafate for burning pain/esopahgitis. Encourage adequate fluid hydration. BRAT diet until symptomatically improved.  - Return precautions outlined in pt discharge instructions.   Dub MikesDowless, Samantha Tripp, [place] JerseyPA-C 08/17/18 [id number]    Marily MemosMesner, Jason, MD 08/19/18 [phone]

## 2018-08-17 NOTE — ED Notes (Signed)
Patient transported to X-ray 

## 2018-08-17 NOTE — ED Provider Notes (Signed)
[facility] Provider Note   CSN: [id number] Arrival date & time: 08/17/18  0048    History   Chief Complaint Chief Complaint  Patient presents with  . Emesis    HPI Douglas Mckinney is a 22 y.o. male.   22 year old male presents to the emergency department for evaluation of nausea and vomiting.  States that he [place] [employer] [place] [employer] when he suddenly had onset of nausea.  Has had multiple episodes of vomiting and is now experiencing only dry heaves.  Notes generalized, constant, aching abdominal pain.  He has not had any associated diarrhea and denies fevers, urinary symptoms, hematemesis.  No history of abdominal surgeries.  No medications taken prior to arrival.     Past Medical History:  Diagnosis Date  . 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 Passive smoke exposure 05/07/2009  . Developmental delay 06/19/2003  . Allergic rhinitis 06/19/2003  . Behavior problems 03/16/2003    No past surgical history on file.      Home Medications    Prior to Admission medications   Medication Sig Start Date End Date Taking? Authorizing Provider  dicyclomine (BENTYL) 20 MG tablet Take 1 tablet (20 mg total) by mouth 2 (two) times daily. Patient not taking: Reported on 08/17/2018 03/29/18   Wurst, [place], PA-C  omeprazole (PRILOSEC) 20 MG capsule Take 1 capsule (20 mg total) by mouth daily. Patient not taking: Reported on 08/17/2018 02/25/18   Eustace MooreNelson, Yvonne Sue, MD  ondansetron (ZOFRAN) 4 MG tablet Take 1 tablet (4 mg total) by mouth every 6 (six) hours. Patient not taking: Reported on 08/17/2018 03/29/18   Wurst, [place]any, PA-C  psyllium (METAMUCIL SMOOTH TEXTURE) 58.6 % powder Take 1 packet by mouth 3 (three) times daily. Patient not taking: Reported on 08/17/2018 04/19/18   Eyvonne MechanicHedges, Jeffrey, PA-C    Family History Family History  Problem  Relation Age of Onset  . Healthy Mother   . Healthy Father     Social History Social History   Tobacco Use  . Smoking status: Current Some Day Smoker  . Smokeless tobacco: Never Used  Substance Use Topics  . Alcohol use: No  . Drug use: Not Currently     Allergies   Patient has no known allergies.   Review of Systems Review of Systems Ten systems reviewed and are negative for acute change, except as noted in the HPI.    Physical Exam Updated Vital Signs BP 130/79 (BP Location: Right Arm)   Pulse 95   Temp 98.5 F (36.9 C) (Oral)   Resp 20   SpO2 100%   Physical Exam Vitals signs and nursing note reviewed.  Constitutional:      General: He is not in acute distress.    Appearance: He is well-developed. He is not diaphoretic.     Comments: Appears uncomfortable, but nontoxic.  HENT:     Head: Normocephalic and atraumatic.  Eyes:     General: No scleral icterus.    Conjunctiva/sclera: Conjunctivae normal.  Neck:     Musculoskeletal: Normal range of motion.  Cardiovascular:     Rate and Rhythm: Normal rate and regular rhythm.     Pulses: Normal pulses.  Pulmonary:     Effort: Pulmonary effort is normal. No respiratory distress.     Comments: Respirations even and unlabored Abdominal:     Comments: Abdomen soft, nondistended.  There is generalized tenderness.  No peritoneal signs.  Musculoskeletal: Normal range of motion.  Skin:    General: Skin is warm and dry.     Coloration: Skin is not pale.     Findings: No erythema or rash.  Neurological:     Mental Status: He is alert and oriented to person, place, and time.     Comments: Moving all extremities spontaneously  Psychiatric:        Behavior: Behavior normal.      ED Treatments / Results  Labs (all labs ordered are listed, but only abnormal results are displayed) Labs Reviewed  COMPREHENSIVE METABOLIC PANEL - Abnormal; Notable for the following components:      Result Value   Potassium 3.3 (*)     CO2 21 (*)    Glucose, Bld 167 (*)    Calcium 10.5 (*)    Albumin 5.1 (*)    Total Bilirubin 2.7 (*)    Anion gap 17 (*)    All other components within normal limits  CBC - Abnormal; Notable for the following components:   WBC 20.0 (*)    RDW 11.4 (*)    All other components within normal limits  LIPASE, BLOOD  URINALYSIS, ROUTINE W REFLEX MICROSCOPIC    EKG None  Radiology No results found.  Procedures Procedures (including critical care time)  Medications Ordered in ED Medications  haloperidol lactate (HALDOL) injection 2 mg (has no administration in time range)  sodium chloride 0.9 % bolus 1,000 mL (0 mLs Intravenous Stopped 08/17/18 0328)  metoCLOPramide (REGLAN) injection 10 mg (10 mg Intravenous Given 08/17/18 0228)  famotidine (PEPCID) IVPB 20 mg premix (0 mg Intravenous Stopped 08/17/18 0328)  ketorolac (TORADOL) 30 MG/ML injection 30 mg (30 mg Intravenous Given 08/17/18 0228)  sodium chloride 0.9 % bolus 1,000 mL (0 mLs Intravenous Stopped 08/17/18 0545)  dicyclomine (BENTYL) injection 20 mg (20 mg Intramuscular Given 08/17/18 0449)    5:15 AM Patient with no tenderness on repeat abdominal exam.  He has not had any persistent vomiting.  States that he feels better.  Has been hydrated with 2 L IV fluids.  Attempting fluid challenge.  6:11 AM Patient reassessed and now vomiting. Emesis appears bilious in nature. Given leukocytosis, plan for US abdomen of the RUQ and DG abdomen. Acute onset of vomiting is atypical for appendicitis. I suspect his leukocytosis is mostly due to stress reaction of prolonged vomiting.    Initial Impression / Assessment and Plan / ED Course  I have reviewed the triage vital signs and the nursing notes.  Pertinent labs & imaging results that were available during my care of the patient were reviewed by me and considered in my medical decision making (see chart for details).     22 year old male presents to the emergency department  for sudden onset of nausea and vomiting which began just prior to arrival tonight.  He has had multiple episodes of vomiting.  Initially complaining of generalized abdominal pain with diffuse tenderness.  On repeat abdominal exam, tenderness had completely resolved.  He has never had any peritoneal signs.  No fever.  Leukocytosis suspected secondary to the stress of repeat emesis.  He has no significant electrolyte derangements.  Liver and kidney function preserved.  Patient with persistent emesis on fluid challenge despite supportive measures.  Will give 2 mg IV Haldol.  He has been ordered to receive x-ray as well as limited ultrasound of the right upper quadrant.  I am not concerned about  his appendix given sudden onset of his symptoms tonight.  Viral etiology vs cannabis hyperemesis favored; UDS pending.  Patient signed out to [facility], PA-C at change of shift pending imaging and symptom control.   Final Clinical Impressions(s) / ED Diagnoses   Final diagnoses:  Nausea and vomiting    ED Discharge Orders    None       Antony Madura, PA-C 08/17/18 1308    Mesner, Barbara Cower, MD 08/19/18 [phone]

## 2018-08-17 NOTE — ED Notes (Signed)
Pt also smells of marijuana. 

## 2018-08-17 NOTE — ED Notes (Signed)
Patient is aware urine is needed, patient stated he is unable to go at this time.

## 2018-08-17 NOTE — ED Triage Notes (Signed)
Pt reports eating [employer] this evening, developed N/V and abd pain after.

## 2018-08-17 NOTE — Discharge Instructions (Signed)
Drink plenty of fluids. Take zofran as needed for nausea. Avoid marijuana use. Take carafate up to 3 times daily for burning pain with swallowing. Return to the ED if you experience severe worsening of your symptoms, fever, abdominal pain, bloody diarrhea.

## 2019-04-06 ENCOUNTER — Other Ambulatory Visit: Payer: Self-pay

## 2019-04-06 DIAGNOSIS — R6889 Other general symptoms and signs: Secondary | ICD-10-CM | POA: Diagnosis not present

## 2019-04-06 DIAGNOSIS — Z20822 Contact with and (suspected) exposure to covid-19: Secondary | ICD-10-CM

## 2019-04-07 LAB — SPECIMEN STATUS REPORT

## 2019-04-07 LAB — NOVEL CORONAVIRUS, NAA: SARS-CoV-2, NAA: NOT DETECTED

## 2019-04-10 ENCOUNTER — Telehealth: Payer: Self-pay | Admitting: General Practice

## 2019-04-10 NOTE — Telephone Encounter (Signed)
Negative COVID results given. Patient results "NOT Detected." Caller expressed understanding. ° °

## 2019-05-19 ENCOUNTER — Encounter (HOSPITAL_COMMUNITY): Payer: Self-pay | Admitting: Family Medicine

## 2019-05-19 ENCOUNTER — Ambulatory Visit (HOSPITAL_COMMUNITY)
Admission: EM | Admit: 2019-05-19 | Discharge: 2019-05-19 | Disposition: A | Discharge status: Home / Self Care | Payer: Medicaid Other | Attending: Family Medicine | Admitting: Family Medicine

## 2019-05-19 ENCOUNTER — Other Ambulatory Visit: Payer: Self-pay

## 2019-05-19 DIAGNOSIS — R140 Abdominal distension (gaseous): Secondary | ICD-10-CM

## 2019-05-19 DIAGNOSIS — R1013 Epigastric pain: Secondary | ICD-10-CM

## 2019-05-19 MED ORDER — POLYETHYLENE GLYCOL 3350 17 GM/SCOOP PO POWD: 17.0000 g | Freq: Every day | ORAL | 0 refills | Status: DC

## 2019-05-19 MED ORDER — OMEPRAZOLE 20 MG PO CPDR: 20.0000 mg | DELAYED_RELEASE_CAPSULE | Freq: Every day | ORAL | 0 refills | Status: DC

## 2019-05-19 NOTE — ED Provider Notes (Signed)
[facility]    CSN: [id number] Arrival date & time: 05/19/19  1316      History   Chief Complaint Chief Complaint  Patient presents with  . Gastroesophageal Reflux    HPI Douglas Mckinney is a 23 y.o. male.   Douglas Mckinney presents with complaints of  With complaints of "trapped gas" sensation and boating. Epigastric pain. Has been ongoing for approximately 1 year but worse over the past few days. Has been constipated, straining to pass small amount of stool. Vomited once a few days ago but otherwise no regular nausea or vomiting. No fevers. States his diet is poor, he regularly eats greasy and spicy foods, works in [employer] and eats it regularly. Has been prescribed medications for this in the past which haven't helped. Doesn't have a PCP. Has tried tums which hasn't helped. No throat pain. Pain can wake him up at night.    ROS per HPI, negative if not otherwise mentioned.      Past Medical History:  Diagnosis Date  . 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 Passive smoke exposure 05/07/2009  . Developmental delay 06/19/2003  . Allergic rhinitis 06/19/2003  . Behavior problems 03/16/2003    History reviewed. No pertinent surgical history.     Home Medications    Prior to Admission medications   Medication Sig Start Date End Date Taking? Authorizing Provider  omeprazole (PRILOSEC) 20 MG capsule Take 1 capsule (20 mg total) by mouth daily. 05/19/19   Georgetta HaberBurky, Natalie B, NP  ondansetron (ZOFRAN ODT) 4 MG disintegrating tablet Take 1 tablet (4 mg total) by mouth every 8 (eight) hours as needed for nausea or vomiting. 08/17/18   Dowless, Lelon MastSamantha Tripp, PA-C  polyethylene glycol powder (GLYCOLAX/MIRALAX) 17 GM/SCOOP powder Take 17 g by mouth daily. 05/19/19   Georgetta HaberBurky, Natalie B, NP  dicyclomine (BENTYL) 20 MG tablet Take 1 tablet (20 mg total) by mouth 2 (two) times daily. Patient not  taking: Reported on 08/17/2018 03/29/18 05/19/19  Wurst, GrenadaBritt[place], PA-C  sucralfate (CARAFATE) 1 GM/10ML suspension Take 10 mLs (1 g total) by mouth 4 (four) times daily -  with meals and at bedtime. 08/17/18 05/19/19  Dowless, Lester KinsmanSamantha Tripp, PA-C    Family History Family History  Problem Relation Age of Onset  . Healthy Mother   . Healthy Father     Social History Social History   Tobacco Use  . Smoking status: Current Some Day Smoker  . Smokeless tobacco: Never Used  Substance Use Topics  . Alcohol use: No  . Drug use: Not Currently     Allergies   Patient has no known allergies.   Review of Systems Review of Systems   Physical Exam Triage Vital Signs ED Triage Vitals  Enc Vitals Group     BP 05/19/19 1446 128/73     Pulse Rate 05/19/19 1446 63     Resp 05/19/19 1446 18     Temp 05/19/19 1446 98.8 F (37.1 C)     Temp Source 05/19/19 1446 Oral     SpO2 05/19/19 1446 98 %     Weight --      Height --      Head Circumference --      Peak Flow --      Pain Score 05/19/19 1345 6     Pain Loc --      Pain Edu? --  Excl. in [place]? --    No data found.  Updated Vital Signs BP 128/73 (BP Location: Right Arm)   Pulse 63   Temp 98.8 F (37.1 C) (Oral)   Resp 18   SpO2 98%    Physical Exam Constitutional:      Appearance: He is well-developed.  Cardiovascular:     Rate and Rhythm: Normal rate.  Pulmonary:     Effort: Pulmonary effort is normal.  Abdominal:     General: Abdomen is flat. Bowel sounds are increased.     Palpations: Abdomen is soft.     Tenderness: There is abdominal tenderness in the epigastric area.  Skin:    General: Skin is warm and dry.  Neurological:     Mental Status: He is alert and oriented to person, place, and time.      UC Treatments / Results  Labs (all labs ordered are listed, but only abnormal results are displayed) Labs Reviewed - No data to display  EKG   Radiology No results found.  Procedures Procedures  (including critical care time)  Medications Ordered in UC Medications - No data to display  Initial Impression / Assessment and Plan / UC Course  I have reviewed the triage vital signs and the nursing notes.  Pertinent labs & imaging results that were available during my care of the patient were reviewed by me and considered in my medical decision making (see chart for details).     Non toxic. Afebrile. Acute on chronic abdominal symptoms. Omeprazole provided, miralax provided. Diet discussed. Encouraged follow up with pcp, hpylori also to be considered and may be appropriate to test for in the future. Return precautions provided. Patient verbalized understanding and agreeable to plan.   Final Clinical Impressions(s) / UC Diagnoses   Final diagnoses:  Abdominal pain, epigastric  Abdominal bloating     Discharge Instructions     Increase fiber in your diet. Regular water intake.  Miralax daily to promote regular bowel movements.  Daily omeprazole to help prevent upper abdominal pain. Limit greasy and spicy foods.  Please follow up with a primary care provider for recheck if symptoms if these persist.  Return to be seen for any worsening of symptoms.    ED Prescriptions    Medication Sig Dispense Auth. Provider   omeprazole (PRILOSEC) 20 MG capsule Take 1 capsule (20 mg total) by mouth daily. 30 capsule Augusto Gamble B, NP   polyethylene glycol powder (GLYCOLAX/MIRALAX) 17 GM/SCOOP powder Take 17 g by mouth daily. 255 g Augusto Gamble B, NP     I have reviewed the PDMP during this encounter.   Zigmund Gottron, NP 05/19/19 1727

## 2019-05-19 NOTE — Discharge Instructions (Signed)
Increase fiber in your diet. Regular water intake.  Miralax daily to promote regular bowel movements.  Daily omeprazole to help prevent upper abdominal pain. Limit greasy and spicy foods.  Please follow up with a primary care provider for recheck if symptoms if these persist.  Return to be seen for any worsening of symptoms.

## 2019-05-19 NOTE — ED Triage Notes (Signed)
Pt reports a history of acid reflux for over one year.  He is not on any medication for this.  He has tried Tums with no relief.

## 2019-05-19 NOTE — ED Provider Notes (Signed)
[place]    CSN: [id number] Arrival date & time: 05/19/19  1316      History   Chief Complaint Chief Complaint  Patient presents with  . Gastroesophageal Reflux    HPI Douglas Mckinney is a 23 y.o. male.   This is a 23 year old male who is complaining about reflux symptoms.  He has been seen before for epigastric pain.  He has been prescribed Zantac and Prilosec but they have not really worked well for him.  He is also been prescribed antispasmodics (Bentyl).     Past Medical History:  Diagnosis Date  . 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 Passive smoke exposure 05/07/2009  . Developmental delay 06/19/2003  . Allergic rhinitis 06/19/2003  . Behavior problems 03/16/2003    History reviewed. No pertinent surgical history.     Home Medications    Prior to Admission medications   Medication Sig Start Date End Date Taking? Authorizing Provider  dicyclomine (BENTYL) 20 MG tablet Take 1 tablet (20 mg total) by mouth 2 (two) times daily. Patient not taking: Reported on 08/17/2018 03/29/18   Wurst, [place], PA-C  omeprazole (PRILOSEC) 20 MG capsule Take 1 capsule (20 mg total) by mouth daily. Patient not taking: Reported on 08/17/2018 02/25/18   Raylene Everts, MD  ondansetron (ZOFRAN ODT) 4 MG disintegrating tablet Take 1 tablet (4 mg total) by mouth every 8 (eight) hours as needed for nausea or vomiting. 08/17/18   Dowless, Aldona Bar Tripp, PA-C  ondansetron (ZOFRAN) 4 MG tablet Take 1 tablet (4 mg total) by mouth every 6 (six) hours. Patient not taking: Reported on 08/17/2018 03/29/18   Wurst, [place], PA-C  psyllium (METAMUCIL SMOOTH TEXTURE) 58.6 % powder Take 1 packet by mouth 3 (three) times daily. Patient not taking: Reported on 08/17/2018 04/19/18   Hedges, Dellis Filbert, PA-C  sucralfate (CARAFATE) 1 GM/10ML suspension Take 10 mLs (1 g total) by mouth 4 (four) times daily -   with meals and at bedtime. 08/17/18   Dowless, Dondra Spry, PA-C    Family History Family History  Problem Relation Age of Onset  . Healthy Mother   . Healthy Father     Social History Social History   Tobacco Use  . Smoking status: Current Some Day Smoker  . Smokeless tobacco: Never Used  Substance Use Topics  . Alcohol use: No  . Drug use: Not Currently     Allergies   Patient has no known allergies.   Review of Systems Review of Systems   Physical Exam Triage Vital Signs ED Triage Vitals  Enc Vitals Group     BP      Pulse      Resp      Temp      Temp src      SpO2      Weight      Height      Head Circumference      Peak Flow      Pain Score      Pain Loc      Pain Edu?      Excl. in [place]?    No data found.  Updated Vital Signs There were no vitals taken for this visit.   Physical Exam   UC Treatments / Results  Labs (all labs ordered are listed, but only abnormal results are displayed) Labs Reviewed - No data to display  EKG   Radiology No results found.  Procedures Procedures (including critical care time)  Medications Ordered in UC Medications - No data to display  Initial Impression / Assessment and Plan / UC Course  I have reviewed the triage vital signs and the nursing notes.  Pertinent labs & imaging results that were available during my care of the patient were reviewed by me and considered in my medical decision making (see chart for details).    Final Clinical Impressions(s) / UC Diagnoses   Final diagnoses:  None   Discharge Instructions   None    ED Prescriptions    None     I have reviewed the PDMP during this encounter.   Elvina Sidle, MD 05/19/19 1432

## 2019-07-31 ENCOUNTER — Ambulatory Visit (HOSPITAL_COMMUNITY)
Admission: EM | Admit: 2019-07-31 | Discharge: 2019-07-31 | Disposition: A | Discharge status: Home / Self Care | Payer: Medicaid Other | Attending: Family Medicine | Admitting: Family Medicine

## 2019-07-31 ENCOUNTER — Encounter (HOSPITAL_COMMUNITY): Payer: Self-pay

## 2019-07-31 ENCOUNTER — Other Ambulatory Visit: Payer: Self-pay

## 2019-07-31 DIAGNOSIS — F17200 Nicotine dependence, unspecified, uncomplicated: Secondary | ICD-10-CM | POA: Insufficient documentation

## 2019-07-31 DIAGNOSIS — J029 Acute pharyngitis, unspecified: Secondary | ICD-10-CM | POA: Diagnosis not present

## 2019-07-31 DIAGNOSIS — R5383 Other fatigue: Secondary | ICD-10-CM

## 2019-07-31 DIAGNOSIS — Z79899 Other long term (current) drug therapy: Secondary | ICD-10-CM | POA: Diagnosis not present

## 2019-07-31 DIAGNOSIS — J069 Acute upper respiratory infection, unspecified: Secondary | ICD-10-CM | POA: Diagnosis not present

## 2019-07-31 DIAGNOSIS — Z20828 Contact with and (suspected) exposure to other viral communicable diseases: Secondary | ICD-10-CM | POA: Insufficient documentation

## 2019-07-31 DIAGNOSIS — R05 Cough: Secondary | ICD-10-CM

## 2019-07-31 LAB — POC SARS CORONAVIRUS 2 AG: SARS Coronavirus 2 Ag: NEGATIVE

## 2019-07-31 LAB — POC SARS CORONAVIRUS 2 AG -  ED: SARS Coronavirus 2 Ag: NEGATIVE

## 2019-07-31 NOTE — Discharge Instructions (Signed)
Rest Drink plenty of fluids Tylenol for pain or fever Use over-the-counter cough and cold medicines You must quarantine at home until your coronavirus test result is available

## 2019-07-31 NOTE — ED Provider Notes (Signed)
[facility]    CSN: [id number] Arrival date & time: 07/31/19  1141      History   Chief Complaint Chief Complaint  Patient presents with  . Sore Throat  . Cough  . Nasal Congestion  . Fatigue  . Abdominal Pain    HPI Douglas Mckinney is a 23 y.o. male.   HPI  Healthy 23 year old Here with upper respiratory symptoms Cough, cold, runny nose and fatigue for 3 days Mild heartburn and decreased appetite Cal smell and taste No sweats or chills or fever Patient states that other family members at home are sick, but they made him come in for testing.  Family believes they have a "cold"  Past Medical History:  Diagnosis Date  . 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 Passive smoke exposure 05/07/2009  . Developmental delay 06/19/2003  . Allergic rhinitis 06/19/2003  . Behavior problems 03/16/2003    History reviewed. No pertinent surgical history.     Home Medications    Prior to Admission medications   Medication Sig Start Date End Date Taking? Authorizing Provider  dicyclomine (BENTYL) 20 MG tablet Take 1 tablet (20 mg total) by mouth 2 (two) times daily. Patient not taking: Reported on 08/17/2018 03/29/18 05/19/19  Wurst, [place], PA-C  omeprazole (PRILOSEC) 20 MG capsule Take 1 capsule (20 mg total) by mouth daily. 05/19/19 07/31/19  Georgetta Haber, NP  sucralfate (CARAFATE) 1 GM/10ML suspension Take 10 mLs (1 g total) by mouth 4 (four) times daily -  with meals and at bedtime. 08/17/18 05/19/19  Dowless, Lester Kinsman, PA-C    Family History Family History  Problem Relation Age of Onset  . Healthy Mother   . Healthy Father     Social History Social History   Tobacco Use  . Smoking status: Current Some Day Smoker  . Smokeless tobacco: Never Used  Substance Use Topics  . Alcohol use: No  . Drug use: Not Currently     Allergies   Patient has no known  allergies.   Review of Systems Review of Systems  Constitutional: Positive for fatigue. Negative for chills and fever.  HENT: Positive for congestion and rhinorrhea. Negative for ear pain and sore throat.   Eyes: Negative for pain and visual disturbance.  Respiratory: Positive for cough. Negative for shortness of breath.   Cardiovascular: Negative for chest pain and palpitations.  Gastrointestinal: Positive for abdominal pain. Negative for vomiting.  Genitourinary: Negative for dysuria and hematuria.  Musculoskeletal: Negative for arthralgias and back pain.  Skin: Negative for color change and rash.  Neurological: Negative for seizures and syncope.  All other systems reviewed and are negative.    Physical Exam Triage Vital Signs ED Triage Vitals  Enc Vitals Group     BP 07/31/19 1319 100/64     Pulse Rate 07/31/19 1319 (!) 57     Resp 07/31/19 1319 14     Temp 07/31/19 1319 97.6 F (36.4 C)     Temp Source 07/31/19 1319 Oral     SpO2 07/31/19 1319 98 %     Weight --      Height --      Head Circumference --      Peak Flow --      Pain Score 07/31/19 1317 2     Pain Loc --      Pain Edu? --  Excl. in [place]? --    No data found.  Updated Vital Signs BP 100/64 (BP Location: Left Arm)   Pulse (!) 57   Temp 97.6 F (36.4 C) (Oral)   Resp 14   SpO2 98%  :     Physical Exam Constitutional:      General: He is not in acute distress.    Appearance: He is well-developed and normal weight. He is not ill-appearing.  HENT:     Head: Normocephalic and atraumatic.     Right Ear: Tympanic membrane normal.     Left Ear: Tympanic membrane normal.     Nose: Rhinorrhea present.     Mouth/Throat:     Mouth: Mucous membranes are moist.     Pharynx: Posterior oropharyngeal erythema present.     Tonsils: No tonsillar exudate or tonsillar abscesses.  Eyes:     Conjunctiva/sclera: Conjunctivae normal.     Pupils: Pupils are equal, round, and reactive to light.  Neck:      Musculoskeletal: Normal range of motion.  Cardiovascular:     Rate and Rhythm: Normal rate.  Pulmonary:     Effort: Pulmonary effort is normal. No respiratory distress.  Abdominal:     General: There is no distension.     Palpations: Abdomen is soft.  Musculoskeletal: Normal range of motion.  Skin:    General: Skin is warm and dry.  Neurological:     General: No focal deficit present.     Mental Status: He is alert.  Psychiatric:        Mood and Affect: Mood normal.        Behavior: Behavior normal.      UC Treatments / Results  Labs (all labs ordered are listed, but only abnormal results are displayed) Labs Reviewed  POC SARS CORONAVIRUS 2 AG -  ED  POC SARS CORONAVIRUS 2 AG    EKG   Radiology No results found.  Procedures Procedures (including critical care time)  Medications Ordered in UC Medications - No data to display  Initial Impression / Assessment and Plan / UC Course  I have reviewed the triage vital signs and the nursing notes.  Pertinent labs & imaging results that were available during my care of the patient were reviewed by me and considered in my medical decision making (see chart for details).     Rapid coronavirus test is negative.  Confirmation test is sent. Final Clinical Impressions(s) / UC Diagnoses   Final diagnoses:  Viral upper respiratory tract infection     Discharge Instructions     Rest Drink plenty of fluids Tylenol for pain or fever Use over-the-counter cough and cold medicines You must quarantine at home until your coronavirus test result is available    ED Prescriptions    None     PDMP not reviewed this encounter.   Raylene Everts, MD 07/31/19 [phone]

## 2019-07-31 NOTE — ED Triage Notes (Signed)
Pt states having nasal congestion, cough, sore throat, fatigue and mild abdominal pain x 3 days.

## 2019-08-02 LAB — NOVEL CORONAVIRUS, NAA (HOSP ORDER, SEND-OUT TO REF LAB; TAT 18-24 HRS): SARS-CoV-2, NAA: NOT DETECTED

## 2020-01-08 ENCOUNTER — Ambulatory Visit: Payer: Medicaid Other | Admitting: Internal Medicine

## 2020-01-16 ENCOUNTER — Encounter: Payer: Self-pay | Admitting: Physician Assistant

## 2020-01-16 ENCOUNTER — Emergency Department (HOSPITAL_COMMUNITY)
Admission: EM | Admit: 2020-01-16 | Discharge: 2020-01-16 | Disposition: A | Discharge status: Home / Self Care | Payer: Medicaid Other | Attending: Emergency Medicine | Admitting: Emergency Medicine

## 2020-01-16 ENCOUNTER — Other Ambulatory Visit: Payer: Self-pay

## 2020-01-16 ENCOUNTER — Encounter (HOSPITAL_COMMUNITY): Payer: Self-pay | Admitting: Emergency Medicine

## 2020-01-16 DIAGNOSIS — F17210 Nicotine dependence, cigarettes, uncomplicated: Secondary | ICD-10-CM | POA: Diagnosis not present

## 2020-01-16 DIAGNOSIS — R109 Unspecified abdominal pain: Secondary | ICD-10-CM | POA: Diagnosis present

## 2020-01-16 DIAGNOSIS — R1084 Generalized abdominal pain: Secondary | ICD-10-CM

## 2020-01-16 DIAGNOSIS — R634 Abnormal weight loss: Secondary | ICD-10-CM | POA: Insufficient documentation

## 2020-01-16 LAB — COMPREHENSIVE METABOLIC PANEL
ALT: 17 U/L (ref 0–44)
AST: 21 U/L (ref 15–41)
Albumin: 4.2 g/dL (ref 3.5–5.0)
Alkaline Phosphatase: 41 U/L (ref 38–126)
Anion gap: 9 (ref 5–15)
BUN: 12 mg/dL (ref 6–20)
CO2: 26 mmol/L (ref 22–32)
Calcium: 9.3 mg/dL (ref 8.9–10.3)
Chloride: 105 mmol/L (ref 98–111)
Creatinine, Ser: 0.85 mg/dL (ref 0.61–1.24)
GFR calc Af Amer: >60 mL/min (ref >60)
GFR calc non Af Amer: >60 mL/min (ref >60)
Glucose, Bld: 84 mg/dL (ref 70–99)
Potassium: 3.9 mmol/L (ref 3.5–5.1)
Sodium: 140 mmol/L (ref 135–145)
Total Bilirubin: 2.2 mg/dL — ABNORMAL HIGH (ref 0.3–1.2)
Total Protein: 6.9 g/dL (ref 6.5–8.1)

## 2020-01-16 LAB — CBC
HCT: 40.6 % (ref 39.0–52.0)
Hemoglobin: 14.1 g/dL (ref 13.0–17.0)
MCH: 33.2 pg (ref 26.0–34.0)
MCHC: 34.7 g/dL (ref 30.0–36.0)
MCV: 95.5 fL (ref 80.0–100.0)
Platelets: 218 10*3/uL (ref 150–400)
RBC: 4.25 MIL/uL (ref 4.22–5.81)
RDW: 11.8 % (ref 11.5–15.5)
WBC: 5.1 10*3/uL (ref 4.0–10.5)
nRBC: 0 % (ref 0.0–0.2)

## 2020-01-16 LAB — URINALYSIS, ROUTINE W REFLEX MICROSCOPIC
Bilirubin Urine: NEGATIVE
Glucose, UA: NEGATIVE mg/dL
Hgb urine dipstick: NEGATIVE
Ketones, ur: NEGATIVE mg/dL
Leukocytes,Ua: NEGATIVE
Nitrite: NEGATIVE
Protein, ur: NEGATIVE mg/dL
Specific Gravity, Urine: 1.019 (ref 1.005–1.030)
pH: 7 (ref 5.0–8.0)

## 2020-01-16 LAB — LIPASE, BLOOD: Lipase: 32 U/L (ref 11–51)

## 2020-01-16 MED ORDER — DICYCLOMINE HCL 20 MG PO TABS: 20.0000 mg | ORAL_TABLET | Freq: Two times a day (BID) | ORAL | 0 refills | Status: DC | PRN

## 2020-01-16 NOTE — ED Provider Notes (Signed)
[facility] EMERGENCY DEPARTMENT Provider Note   CSN: [id number] Arrival date & time: 01/16/20  2458     History Chief Complaint  Patient presents with  . Abdominal Pain    Douglas Mckinney is a 24 y.o. male.  Patient with no significant medical or surgical history presents with intermittent abdominal discomfort for the past year and a half.  Severity varies depending on the week and at times worse with fatty foods or processed foods.  Mild improvement with plant-based diet.  Patient denies fevers chills or vomiting, no blood in the stool.  Patient feels over this time he is lost was 20 pounds.  No family history concerning for inflammatory bowel disease or other colon issues.  Currently symptoms mild.        Past Medical History:  Diagnosis Date  . 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 Passive smoke exposure 05/07/2009  . Developmental delay 06/19/2003  . Allergic rhinitis 06/19/2003  . Behavior problems 03/16/2003    History reviewed. No pertinent surgical history.     Family History  Problem Relation Age of Onset  . Healthy Mother   . Healthy Father     Social History   Tobacco Use  . Smoking status: Current Some Day Smoker  . Smokeless tobacco: Never Used  Substance Use Topics  . Alcohol use: No  . Drug use: Not Currently    Home Medications Prior to Admission medications   Medication Sig Start Date End Date Taking? Authorizing Provider  dicyclomine (BENTYL) 20 MG tablet Take 1 tablet (20 mg total) by mouth 2 (two) times daily as needed for spasms. 01/16/20   Blane Ohara, MD  omeprazole (PRILOSEC) 20 MG capsule Take 1 capsule (20 mg total) by mouth daily. 05/19/19 07/31/19  Georgetta Haber, NP  sucralfate (CARAFATE) 1 GM/10ML suspension Take 10 mLs (1 g total) by mouth 4 (four) times daily -  with meals and at bedtime. 08/17/18 05/19/19  Dowless, Lester Kinsman, PA-C    Allergies    Patient has no known allergies.  Review of Systems   Review of Systems  Constitutional: Positive for unexpected weight change. Negative for chills and fever.  HENT: Negative for congestion.   Eyes: Negative for visual disturbance.  Respiratory: Negative for shortness of breath.   Cardiovascular: Negative for chest pain.  Gastrointestinal: Positive for abdominal pain. Negative for vomiting.  Genitourinary: Negative for dysuria and flank pain.  Musculoskeletal: Negative for back pain, neck pain and neck stiffness.  Skin: Negative for rash.  Neurological: Negative for light-headedness and headaches.    Physical Exam Updated Vital Signs BP (!) 109/56 (BP Location: Left Arm)   Pulse (!) 58   Temp 97.9 F (36.6 C) (Oral)   Resp 16   SpO2 100%   Physical Exam Vitals and nursing note reviewed.  Constitutional:      Appearance: He is well-developed.  HENT:     Head: Normocephalic and atraumatic.  Eyes:     General:        Right eye: No discharge.        Left eye: No discharge.     Conjunctiva/sclera: Conjunctivae normal.  Neck:     Trachea: No tracheal deviation.  Cardiovascular:     Rate and Rhythm: Normal rate and regular rhythm.  Pulmonary:     Effort: Pulmonary effort is normal.     Breath  sounds: Normal breath sounds.  Abdominal:     General: There is no distension.     Palpations: Abdomen is soft.     Tenderness: There is abdominal tenderness in the periumbilical area. There is no guarding.     Hernia: No hernia is present.  Musculoskeletal:     Cervical back: Normal range of motion and neck supple.  Skin:    General: Skin is warm.     Findings: No rash.  Neurological:     Mental Status: He is alert and oriented to person, place, and time.     ED Results / Procedures / Treatments   Labs (all labs ordered are listed, but only abnormal results are displayed) Labs Reviewed  COMPREHENSIVE METABOLIC PANEL - Abnormal; Notable for  the following components:      Result Value   Total Bilirubin 2.2 (*)    All other components within normal limits  LIPASE, BLOOD  CBC  URINALYSIS, ROUTINE W REFLEX MICROSCOPIC    EKG None  Radiology No results found.  Procedures Ultrasound ED Abd  Date/Time: 01/16/2020 12:28 PM Performed by: Elnora Morrison, MD Authorized by: Elnora Morrison, MD   Procedure details:    Indications: abdominal pain     Assessment for:  Gallstones   Hepatobiliary:  Visualized   Images: archived    Hepatobiliary findings:    Gallbladder wall:  Normal   Gallbladder stones: not identified     Intra-abdominal fluid: not identified     (including critical care time)  Medications Ordered in ED Medications - No data to display  ED Course  I have reviewed the triage vital signs and the nursing notes.  Pertinent labs & imaging results that were available during my care of the patient were reviewed by me and considered in my medical decision making (see chart for details).    MDM Rules/Calculators/A&P                      Patient presents with recurrent abdominal discomfort for over 1 year along with weight loss.  Discussed differential diagnosis including inflammatory bowel disease, food related sensitivities, pancreatitis, gallbladder related, other.  Blood work reviewed normal white blood cell count, normal hemoglobin, mild total bilirubin 2.2, normal liver function testing otherwise.  Urinalysis no signs of infection.  Lipase normal.  Patient well-appearing in minimal discomfort in the ER.  Bedside ultrasound normal gallbladder no gallstones.  Patient stable for close outpatient follow-up with gastroenterology given length of symptoms.  Results and differential diagnosis were discussed with the patient/parent/guardian. Xrays were independently reviewed by myself.  Close follow up outpatient was discussed, comfortable with the plan.   Medications - No data to display  Vitals:   01/16/20  0951  BP: (!) 109/56  Pulse: (!) 58  Resp: 16  Temp: 97.9 F (36.6 C)  TempSrc: Oral  SpO2: 100%    Final diagnoses:  Weight loss  Abdominal pain, generalized    Final Clinical Impression(s) / ED Diagnoses Final diagnoses:  Weight loss  Abdominal pain, generalized    Rx / DC Orders ED Discharge Orders         Ordered    dicyclomine (BENTYL) 20 MG tablet  2 times daily PRN     01/16/20 1223           Elnora Morrison, MD 01/16/20 1228

## 2020-01-16 NOTE — Discharge Instructions (Signed)
Use Tylenol every 4 hours as needed for pain.  You can try Bentyl for any stomach cramping discomfort to see if it helps.  Follow-up closely with gastroenterology for further evaluation.  If you develop fevers, localized abdominal pain, blood in stool, persistent vomiting or new concerns return to the emergency room.

## 2020-01-16 NOTE — ED Triage Notes (Signed)
Pt endorses upper and lower abd pain for a year. Reports constipation for a few weeks.

## 2020-02-08 ENCOUNTER — Other Ambulatory Visit (INDEPENDENT_AMBULATORY_CARE_PROVIDER_SITE_OTHER): Payer: Medicaid Other

## 2020-02-08 ENCOUNTER — Ambulatory Visit (INDEPENDENT_AMBULATORY_CARE_PROVIDER_SITE_OTHER): Payer: Medicaid Other | Admitting: Physician Assistant

## 2020-02-08 ENCOUNTER — Encounter: Payer: Self-pay | Admitting: Physician Assistant

## 2020-02-08 VITALS — BP 98/60 | HR 64 | Ht 69.0 in | Wt 120.0 lb

## 2020-02-08 DIAGNOSIS — R1084 Generalized abdominal pain: Secondary | ICD-10-CM | POA: Diagnosis not present

## 2020-02-08 DIAGNOSIS — K5900 Constipation, unspecified: Secondary | ICD-10-CM | POA: Diagnosis not present

## 2020-02-08 DIAGNOSIS — R140 Abdominal distension (gaseous): Secondary | ICD-10-CM

## 2020-02-08 DIAGNOSIS — R110 Nausea: Secondary | ICD-10-CM

## 2020-02-08 LAB — C-REACTIVE PROTEIN: CRP: <1 mg/dL (ref 0.5–20.0)

## 2020-02-08 LAB — SEDIMENTATION RATE: Sed Rate: 5 mm/hr (ref 0–15)

## 2020-02-08 LAB — IGA: IgA: 157 mg/dL (ref 68–378)

## 2020-02-08 MED ORDER — DICYCLOMINE HCL 10 MG PO CAPS: 10.0000 mg | ORAL_CAPSULE | Freq: Two times a day (BID) | ORAL | 3 refills | Status: DC | PRN

## 2020-02-08 NOTE — Patient Instructions (Addendum)
If you are age 22 or older, your body mass index should be between 23-30. Your Body mass index is 17.72 kg/m. If this is out of the aforementioned range listed, please consider follow up with your Primary Care Provider.  If you are age 58 or younger, your body mass index should be between 19-25. Your Body mass index is 17.72 kg/m. If this is out of the aformentioned range listed, please consider follow up with your Primary Care Provider.   Your provider has requested that you go to the basement level for lab work before leaving today. Press "B" on the elevator. The lab is located at the first door on the left as you exit the elevator.  Due to recent changes in healthcare laws, you may see the results of your imaging and laboratory studies on MyChart before your provider has had a chance to review them.  We understand that in some cases there may be results that are confusing or concerning to you. Not all laboratory results come back in the same time frame and the provider may be waiting for multiple results in order to interpret others.  Please give [place] 48 hours in order for your provider to thoroughly review all the results before contacting the office for clarification of your results.   You have been scheduled for a CT scan of the abdomen and pelvis at [facility], 1st floor Radiology.  You are scheduled on 02/16/20 at 3:30 pm. You should arrive 15 minutes prior to your appointment time for registration. Be sure not to eat or drink anything 4 hours prior.  Please go to [facility] Radiology at least 3 days prior to your procedure to pick up instructions and contrast for the exam.  WARNING: IF YOU ARE ALLERGIC TO IODINE/X-RAY DYE, PLEASE NOTIFY RADIOLOGY IMMEDIATELY AT [phone]! YOU WILL BE GIVEN A 13 HOUR PREMEDICATION PREP.   If you have any questions regarding your exam or if you need to reschedule, you may call [phone] between the hours of 8:00 am and 5:00 pm,  Monday-Friday.  ________________________________________________________________________  START Bentyl 10 mg 1 capsule twice daily as needed for abdominal pain/cramping  Miralax 1 capful in 8 ounce of water/juice every day/ every other day for constipation.  Avoid Lactose/dairy and fried foods  Increase water intake to at least 60 ounces daily.  You are scheduled to follow up with Mike Gip on February 29, 2020 at 1:30 pm

## 2020-02-08 NOTE — Progress Notes (Signed)
Subjective:    Patient ID: Douglas Mckinney, male    DOB: [birth year], 24 y.o.   MRN: [id number]  HPI Douglas Mckinney is a pleasant 24 year old African-American male, new to GI today referred by the [facility] emergency room after visit there on 01/16/2020 with complaints of abdominal pain and weight loss. He did not have any abdominal imaging done at that time, labs were done with normal CBC, lipase, and c-Met unremarkable with exception of T bili at 2.2, UA was negative. He did have bedside ultrasound done which was read as negative he was given a prescription for dicyclomine which he says was somewhat helpful. Patient says that he has been having current symptoms for the past year and a half which have gradually progressed.  He describes rather generalized abdominal discomfort that can be present in different areas of the abdomen but with some discomfort present on a daily basis.  He does feel that certain foods will exacerbate his symptoms including fried or greasy foods.  He thinks he may be sensitive to lactose but is not entirely certain.  He has been trying to eat more healthy recently.  He also relates some low-grade nausea or queasiness without vomiting, no heartburn or indigestion no dysphagia or odynophagia.  No fevers or chills.  He has been constipated over the past year and a half is well now going at least 3 to 4 days between bowel movements.  He has not noted any melena or hematochezia.  He tells me his weight has been relatively stable recently between 116 and 120, though the ER note related that he may have lost about 20 pounds over the past couple of years. He does feel that sodas and some fruit juices will "sit heavy" on his abdomen. Denies any regular aspirin or NSAID use, no regular EtOH use.  No family history of GI disease that he is aware of.  Review of Systems Pertinent positive and negative review of systems were noted in the above HPI section.  All other review of systems was otherwise  negative.  Outpatient Encounter Medications as of 02/08/2020  Medication Sig   dicyclomine (BENTYL) 10 MG capsule Take 1 capsule (10 mg total) by mouth 2 (two) times daily as needed for spasms (abd apin/cramping).   [DISCONTINUED] dicyclomine (BENTYL) 20 MG tablet Take 1 tablet (20 mg total) by mouth 2 (two) times daily as needed for spasms.   [DISCONTINUED] omeprazole (PRILOSEC) 20 MG capsule Take 1 capsule (20 mg total) by mouth daily.   [DISCONTINUED] sucralfate (CARAFATE) 1 GM/10ML suspension Take 10 mLs (1 g total) by mouth 4 (four) times daily -  with meals and at bedtime.   No facility-administered encounter medications on file as of 02/08/2020.   No Known Allergies Patient Active Problem List   Diagnosis Date Noted   Passive smoke exposure 05/07/2009   Developmental delay 06/19/2003   Allergic rhinitis 06/19/2003   Behavior problems 03/16/2003   Social History   Socioeconomic History   Marital status: Single    Spouse name: Not on file   Number of children: Not on file   Years of education: Not on file   Highest education level: Not on file  Occupational History   Not on file  Tobacco Use   Smoking status: Current Some Day Smoker   Smokeless tobacco: Never Used  Substance and Sexual Activity   Alcohol use: No   Drug use: Yes    Types: Marijuana   Sexual activity: Yes  Birth control/protection: None  Other Topics Concern   Not on file  Social History Narrative   Not on file   Social Determinants of Health   Financial Resource Strain:    Difficulty of Paying Living Expenses:   Food Insecurity:    Worried About Charity fundraiser in the Last Year:    Arboriculturist in the Last Year:   Transportation Needs:    Film/video editor (Medical):    Lack of Transportation (Non-Medical):   Physical Activity:    Days of Exercise per Week:    Minutes of Exercise per Session:   Stress:    Feeling of Stress :   Social Connections:      Frequency of Communication with Friends and Family:    Frequency of Social Gatherings with Friends and Family:    Attends Religious Services:    Active Member of Clubs or Organizations:    Attends Music therapist:    Marital Status:   Intimate Partner Violence:    Fear of Current or Ex-Partner:    Emotionally Abused:    Physically Abused:    Sexually Abused:     Mr. Cawood family history includes Healthy in his father and mother.      Objective:    Vitals:   02/08/20 1115  BP: 98/60  Pulse: 64    Physical Exam Well-developed well-nourished young African-American male in no acute distress.  Height, Weight 120, BMI 17.7  HEENT; nontraumatic normocephalic, EOMI, PE R R LA, sclera anicteric. Oropharynx; not examined Neck; supple, no JVD Cardiovascular; regular rate and rhythm with S1-S2, no murmur rub or gallop Pulmonary; Clear bilaterally Abdomen; soft, minimal tenderness in the right lower quadrant, otherwise no focal tenderness nondistended, no palpable mass or hepatosplenomegaly, bowel sounds are active Rectal; not done today Skin; benign exam, no jaundice rash or appreciable lesions Extremities; no clubbing cyanosis or edema skin warm and dry Neuro/Psych; alert and oriented x4, grossly nonfocal mood and affect appropriate       Assessment & Plan:   #34 24 year old African-American male with 1-1/2-year history of fairly constant abdominal discomfort of varying degrees, constipation, low-grade nausea. Etiology of symptoms is not entirely clear.  Recent labs are reassuring. Rule out IBS with constipation.  Rule out lactose and possible fructose intolerance. Rule out IBD.  Plan; patient advised to avoid fried and greasy foods, minimize lactose intake. Start dicyclomine 10 mg p.o. twice daily, refill sent Schedule for CT of the abdomen and pelvis with contrast. Check sed rate, CRP, TTG and IgA. Start MiraLAX 17 g in 8 ounces of water daily,  and increase water intake to about 60 ounces per day. We will plan to follow-up in the office in about 3 weeks, other recommendations pending findings at CT.  Patient will be established with Dr. Havery Moros.   Denea Cheaney S Julion Gatt PA-C 02/08/2020   Cc: No ref. provider found

## 2020-02-09 LAB — TISSUE TRANSGLUTAMINASE, IGA: (tTG) Ab, IgA: 1 U/mL

## 2020-02-12 NOTE — Progress Notes (Signed)
Agree with assessment and plan as outlined.  

## 2020-02-16 ENCOUNTER — Ambulatory Visit (HOSPITAL_COMMUNITY)
Admission: RE | Admit: 2020-02-16 | Discharge: 2020-02-16 | Disposition: A | Discharge status: Home / Self Care | Payer: Medicaid Other | Source: Ambulatory Visit | Attending: Physician Assistant | Admitting: Physician Assistant

## 2020-02-16 ENCOUNTER — Other Ambulatory Visit: Payer: Self-pay

## 2020-02-16 DIAGNOSIS — R140 Abdominal distension (gaseous): Secondary | ICD-10-CM | POA: Insufficient documentation

## 2020-02-16 DIAGNOSIS — K5900 Constipation, unspecified: Secondary | ICD-10-CM | POA: Insufficient documentation

## 2020-02-16 DIAGNOSIS — R1084 Generalized abdominal pain: Secondary | ICD-10-CM

## 2020-02-16 DIAGNOSIS — R110 Nausea: Secondary | ICD-10-CM | POA: Insufficient documentation

## 2020-02-16 MED ORDER — IOHEXOL 300 MG/ML  SOLN
100.0000 mL | Freq: Once | INTRAMUSCULAR | Status: AC
  Administered 2020-02-16: 100 mL via INTRAVENOUS

## 2020-02-16 MED ORDER — SODIUM CHLORIDE (PF) 0.9 % IJ SOLN
INTRAMUSCULAR | Status: AC
  Filled 2020-02-16: qty 50

## 2020-02-20 ENCOUNTER — Other Ambulatory Visit: Payer: Self-pay

## 2020-02-20 DIAGNOSIS — R1084 Generalized abdominal pain: Secondary | ICD-10-CM

## 2020-02-20 MED ORDER — DICYCLOMINE HCL 10 MG PO CAPS: 10.0000 mg | ORAL_CAPSULE | Freq: Two times a day (BID) | ORAL | 3 refills | Status: DC | PRN

## 2020-02-27 ENCOUNTER — Telehealth (INDEPENDENT_AMBULATORY_CARE_PROVIDER_SITE_OTHER): Payer: Medicaid Other | Admitting: Internal Medicine

## 2020-02-27 ENCOUNTER — Encounter: Payer: Self-pay | Admitting: Internal Medicine

## 2020-02-27 DIAGNOSIS — Z7689 Persons encountering health services in other specified circumstances: Secondary | ICD-10-CM | POA: Diagnosis not present

## 2020-02-27 DIAGNOSIS — R109 Unspecified abdominal pain: Secondary | ICD-10-CM | POA: Diagnosis not present

## 2020-02-27 NOTE — Patient Instructions (Signed)
Thank you for choosing Primary Care at [facility] to be your medical home!    Douglas Mckinney was seen by De Hollingshead, DO today.   Douglas Mckinney's primary care provider is Douglas Siren, DO.   For the best care possible, you should try to see Douglas Siren, DO whenever you come to the clinic.   We look forward to seeing you again soon!  If you have any questions about your visit today, please call us at [phone] or feel free to reach your primary care provider via MyChart.

## 2020-02-27 NOTE — Progress Notes (Signed)
Virtual Visit via Telephone Note  I connected with Douglas Mckinney, on 02/27/2020 at 8:55 AM by telephone due to the COVID-19 pandemic and verified that I am speaking with the correct person using two identifiers.   Consent: I discussed the limitations, risks, security and privacy concerns of performing an evaluation and management service by telephone and the availability of in person appointments. I also discussed with the patient that there may be a patient responsible charge related to this service. The patient expressed understanding and agreed to proceed.   Location of Patient: Home   Location of Provider: Clinic    Persons participating in Telemedicine visit: Khristian Omega Slager [facility] Dr. Juleen China      History of Present Illness: Patient has a visit to establish care. No significant PMH. No past surgical history. He does endorse THC use. No tobacco use. He had a pediatrician but has aged out and never transitioned to PCP.   Patient went to the ER on 5/18 for >1 year of chronic abdominal discomfort. In the ED he had negative RUQ ultrasound. Labs were done with normal CBC, lipase, and c-Met unremarkable with exception of T bili at 2.2, UA was negative.  Patient is currently taking Dicyclomine 10 mg BID. This has helped with his symptoms and he is working on keeping track of food triggers. Recently had a CT abdomen that was negative. Has GI follow up 7/1.    Past Medical History:  Diagnosis Date  . 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 dicyclomine (BENTYL) 10 MG capsule Take 1 capsule (10 mg total) by mouth 2 (two) times daily as needed for spasms (abd apin/cramping). 60 capsule 3  . [DISCONTINUED] omeprazole (PRILOSEC) 20 MG capsule Take 1 capsule (20 mg total) by mouth daily. 30 capsule 0  . [DISCONTINUED] sucralfate (CARAFATE) 1  GM/10ML suspension Take 10 mLs (1 g total) by mouth 4 (four) times daily -  with meals and at bedtime. 60 mL 0   No current facility-administered medications on file prior to visit.    Observations/Objective: NAD. Speaking clearly.  Work of breathing normal.  Alert and oriented. Mood appropriate.   Assessment and Plan: 1. Encounter to establish care Reviewed patient's PMH, social history, surgical history, and medications.  Is overdue for annual exam, screening blood work, and health maintenance topics. Have asked patient to return for visit to address these items.   2. Abdominal discomfort Resolving. Has had negative work up thus far. Discussed monitoring for food triggers.      Follow Up Instructions: Annual exam    I discussed the assessment and treatment plan with the patient. The patient was provided an opportunity to ask questions and all were answered. The patient agreed with the plan and demonstrated an understanding of the instructions.   The patient was advised to call back or seek an in-person evaluation if the symptoms worsen or if the condition fails to improve as anticipated.     I provided 12 minutes total of non-face-to-face time during this encounter including median intraservice time, reviewing previous notes, investigations, ordering medications, medical decision making, coordinating care and patient verbalized understanding at the end of the visit.    Phill Myron, D.O. Primary Care at [facility]  02/27/2020, 8:55 AM

## 2020-02-29 ENCOUNTER — Ambulatory Visit: Payer: Medicaid Other | Admitting: Physician Assistant

## 2020-04-03 ENCOUNTER — Telehealth: Payer: Self-pay

## 2020-04-03 NOTE — Telephone Encounter (Signed)
Called patient to do their pre-visit COVID screening.  Patient states that he isn't able to get off work tomorrow. Will call back to reschedule.

## 2020-04-04 ENCOUNTER — Encounter: Payer: Medicaid Other | Admitting: Internal Medicine

## 2020-04-18 ENCOUNTER — Ambulatory Visit: Payer: Medicaid Other | Admitting: Internal Medicine

## 2020-08-27 ENCOUNTER — Encounter (HOSPITAL_COMMUNITY): Payer: Self-pay

## 2020-08-27 DIAGNOSIS — R1013 Epigastric pain: Secondary | ICD-10-CM | POA: Diagnosis not present

## 2020-08-27 DIAGNOSIS — F17200 Nicotine dependence, unspecified, uncomplicated: Secondary | ICD-10-CM | POA: Diagnosis not present

## 2020-08-27 DIAGNOSIS — R0981 Nasal congestion: Secondary | ICD-10-CM | POA: Insufficient documentation

## 2020-08-27 DIAGNOSIS — R112 Nausea with vomiting, unspecified: Secondary | ICD-10-CM | POA: Diagnosis not present

## 2020-08-27 DIAGNOSIS — K5900 Constipation, unspecified: Secondary | ICD-10-CM | POA: Diagnosis not present

## 2020-08-27 DIAGNOSIS — R1084 Generalized abdominal pain: Secondary | ICD-10-CM | POA: Diagnosis not present

## 2020-08-27 DIAGNOSIS — R1111 Vomiting without nausea: Secondary | ICD-10-CM | POA: Diagnosis not present

## 2020-08-27 LAB — COMPREHENSIVE METABOLIC PANEL
ALT: 18 U/L (ref 0–44)
AST: 31 U/L (ref 15–41)
Albumin: 5.1 g/dL — ABNORMAL HIGH (ref 3.5–5.0)
Alkaline Phosphatase: 56 U/L (ref 38–126)
Anion gap: 16 — ABNORMAL HIGH (ref 5–15)
BUN: 21 mg/dL — ABNORMAL HIGH (ref 6–20)
CO2: 22 mmol/L (ref 22–32)
Calcium: 9.8 mg/dL (ref 8.9–10.3)
Chloride: 102 mmol/L (ref 98–111)
Creatinine, Ser: 1.12 mg/dL (ref 0.61–1.24)
GFR, Estimated: >60 mL/min (ref >60)
Glucose, Bld: 150 mg/dL — ABNORMAL HIGH (ref 70–99)
Potassium: 3.5 mmol/L (ref 3.5–5.1)
Sodium: 140 mmol/L (ref 135–145)
Total Bilirubin: 1.4 mg/dL — ABNORMAL HIGH (ref 0.3–1.2)
Total Protein: 8.2 g/dL — ABNORMAL HIGH (ref 6.5–8.1)

## 2020-08-27 LAB — CBC
HCT: 41.8 % (ref 39.0–52.0)
Hemoglobin: 14.5 g/dL (ref 13.0–17.0)
MCH: 32.2 pg (ref 26.0–34.0)
MCHC: 34.7 g/dL (ref 30.0–36.0)
MCV: 92.7 fL (ref 80.0–100.0)
Platelets: 196 10*3/uL (ref 150–400)
RBC: 4.51 MIL/uL (ref 4.22–5.81)
RDW: 11.6 % (ref 11.5–15.5)
WBC: 11.5 10*3/uL — ABNORMAL HIGH (ref 4.0–10.5)
nRBC: 0 % (ref 0.0–0.2)

## 2020-08-27 LAB — LIPASE, BLOOD: Lipase: 22 U/L (ref 11–51)

## 2020-08-27 MED ORDER — ONDANSETRON 4 MG PO TBDP
4.0000 mg | ORAL_TABLET | Freq: Once | ORAL | Status: AC | PRN
  Administered 2020-08-27: 4 mg via ORAL
  Filled 2020-08-27: qty 1

## 2020-08-27 NOTE — ED Notes (Signed)
Pt has been seen laying himself in the floor numerous times. This Clinical research associate has checked on pt each time he has placed himself in the floor. Pt sts" Its where I feel the most comfortable". Pt gets up wo any assistance. Pt has remained A/O x4.

## 2020-08-27 NOTE — ED Notes (Signed)
Spoke with patient about laying in the floor and multiple germs present . Patient stated, "I am going to lay where dI find it more comfortable."

## 2020-08-27 NOTE — ED Notes (Signed)
Patient seen sitting in the bathroom floor. Patient was observed by Marchelle Folks, NT walking out of the bathroom stall and saw him sit down.

## 2020-08-27 NOTE — ED Triage Notes (Signed)
Pt presents with c/o abdominal pain and vomiting since 9:30 this morning. Pt took some alka seltzer earlier for his sinuses and then smoked some weed to help with the pain.

## 2020-08-27 NOTE — ED Notes (Signed)
Registration and screening staff advised that pt is on the floor.  This Clinical research associate went out to check on pt and he is found to be breathing and conscious. I recommended pt not lay on the floor and pt just kept repeating "I cant breathe". This Chief Operating Officer assisted pt with standing and then sitting in wheelchair. This Clinical research associate then rechecked pt's vital signs. Of note is his oxygen level which was 100% on room air. Pt has respirations WNL and is facetiming his mother throughout v/s.

## 2020-08-27 NOTE — ED Triage Notes (Signed)
Security came to get this RN and reported that pt was laying on the floor. This RN did not see pt fall. Security reported that he was walking around the lobby and then fell onto the floor. Pt helped into a wheelchair. Pt sitting in a wheelchair in front of the nursing station at this time.

## 2020-08-28 ENCOUNTER — Emergency Department (HOSPITAL_COMMUNITY)
Admission: EM | Admit: 2020-08-28 | Discharge: 2020-08-28 | Disposition: A | Discharge status: Home / Self Care | Payer: Medicaid Other | Attending: Emergency Medicine | Admitting: Emergency Medicine

## 2020-08-28 DIAGNOSIS — R1084 Generalized abdominal pain: Secondary | ICD-10-CM

## 2020-08-28 DIAGNOSIS — R1013 Epigastric pain: Secondary | ICD-10-CM

## 2020-08-28 MED ORDER — ONDANSETRON 4 MG PO TBDP
4.0000 mg | ORAL_TABLET | Freq: Once | ORAL | Status: AC
  Administered 2020-08-28: 08:00:00 4 mg via ORAL
  Filled 2020-08-28: qty 1

## 2020-08-28 MED ORDER — DICYCLOMINE HCL 10 MG PO CAPS: 10.0000 mg | ORAL_CAPSULE | Freq: Two times a day (BID) | ORAL | 0 refills | Status: DC | PRN

## 2020-08-28 MED ORDER — KETOROLAC TROMETHAMINE 15 MG/ML IJ SOLN
30.0000 mg | Freq: Once | INTRAMUSCULAR | Status: AC
  Administered 2020-08-28: 08:00:00 30 mg via INTRAMUSCULAR
  Filled 2020-08-28: qty 2

## 2020-08-28 MED ORDER — ONDANSETRON 4 MG PO TBDP: 4.0000 mg | ORAL_TABLET | Freq: Three times a day (TID) | ORAL | 0 refills | Status: DC | PRN

## 2020-08-28 MED ORDER — FAMOTIDINE 20 MG PO TABS: 20.0000 mg | ORAL_TABLET | Freq: Every day | ORAL | 0 refills | Status: DC

## 2020-08-28 MED ORDER — FAMOTIDINE 20 MG PO TABS
20.0000 mg | ORAL_TABLET | Freq: Once | ORAL | Status: AC
  Administered 2020-08-28: 20 mg via ORAL
  Filled 2020-08-28: qty 1

## 2020-08-28 MED ORDER — DICYCLOMINE HCL 10 MG PO CAPS
20.0000 mg | ORAL_CAPSULE | Freq: Once | ORAL | Status: AC
  Administered 2020-08-28: 20 mg via ORAL
  Filled 2020-08-28: qty 2

## 2020-08-28 NOTE — Discharge Instructions (Signed)
Your work-up today was reassuring.  Please drink plenty of water.  Please refrain from taking any more Alka-Seltzer for the time being.  Please follow-up your primary care doctor.  You may always return to the ER for any new or concerning symptoms.  I have prescribed you 3 medications.  Zofran which is a nausea medicine, Bentyl which is for abdominal cramps, and Pepcid.  Please take these medications as prescribed.

## 2020-08-28 NOTE — ED Notes (Addendum)
Pt unable to provide a urine sample at this time. Pt stated that when he took alka-seltzer tablet, he swallowed the medication whole; without letting the tablet dissolve in water beforehand.

## 2020-08-28 NOTE — ED Provider Notes (Signed)
[facility]-EMERGENCY DEPT Provider Note   CSN: [id number] Arrival date & time: 08/27/20  1435     History Chief Complaint  Patient presents with  . Abdominal Pain    Douglas Mckinney is a 24 y.o. male.  HPI  Patient is a 24 year old male with no pertinent past medical history apart from chronic abdominal issues including abdominal cramps and nausea.  He is presented today with approximately 22 hours of epigastric abdominal pain.  He endorses nausea and multiple episodes of vomiting that was nonbloody nonbilious.  He states he is also hiccuping frequently.  He states that on Monday he had some sinus congestion and runny nose at that time took 2 Alka-Seltzer by swallowing then and chasing with a small amount of water.  He states that his stomach felt somewhat upset at that time but the sensation passed.  He states that he took 2 more Alka-Seltzer's in similar fashion at 9:30 AM yesterday morning and has had dyspepsia and epigastric abdominal pain since that time.  He denies any chest pain, shortness of breath, fevers, chills, lightheadedness, dizziness, back pain, urinary symptoms, changes in bowel movements.  He states he is wise feels well.  He describes his epigastric abdominal pain as achy and 10/10.  No radiation of his pain. Notably patient does have a past medical history significant for chronic abdominal pain.  This seems to be well controlled with Bentyl and Pepcid and he is scheduled to see GI in the future.  He is followed by family medicine doctor at this time.    Past Medical History:  Diagnosis Date  . 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 Developmental delay 06/19/2003  . Allergic rhinitis 06/19/2003    History reviewed. No pertinent surgical history.     Family History  Problem Relation Age of Onset  . Healthy Mother   . Healthy Father     Social History    Tobacco Use  . Smoking status: Current Some Day Smoker  . Smokeless tobacco: Never Used  Substance Use Topics  . Alcohol use: No  . Drug use: Yes    Types: Marijuana    Home Medications Prior to Admission medications   Medication Sig Start Date End Date Taking? Authorizing Provider  famotidine (PEPCID) 20 MG tablet Take 1 tablet (20 mg total) by mouth daily for 14 days. 08/28/20 09/11/20 Yes Aissa Lisowski S, PA  ibuprofen (ADVIL) 200 MG tablet Take 400 mg by mouth every 6 (six) hours as needed for mild pain.   Yes [provider]  ondansetron (ZOFRAN ODT) 4 MG disintegrating tablet Take 1 tablet (4 mg total) by mouth every 8 (eight) hours as needed for nausea or vomiting. 08/28/20  Yes Kessler Kopinski S, PA  Phenyleph-Doxyl-DM-Aspirin (ALKA-SELTZER PLUS COLD DY/NGHT PO) Take 2 tablets by mouth every 6 (six) hours as needed (cold symptoms).   Yes [provider]  dicyclomine (BENTYL) 10 MG capsule Take 1 capsule (10 mg total) by mouth 2 (two) times daily as needed for up to 14 days for spasms (abd apin/cramping). 08/28/20 09/11/20  Gailen Shelter, PA  omeprazole (PRILOSEC) 20 MG capsule Take 1 capsule (20 mg total) by mouth daily. 05/19/19 07/31/19  Georgetta Haber, NP  sucralfate (CARAFATE) 1 GM/10ML suspension Take 10 mLs (1 g total) by mouth 4 (four) times daily -  with meals and at bedtime. 08/17/18 05/19/19  Dowless, Lester Kinsman, PA-C    Allergies    Patient has no known allergies.  Review of Systems   Review of Systems  Constitutional: Negative for chills and fever.  HENT: Positive for congestion.   Eyes: Negative for pain.  Respiratory: Negative for cough and shortness of breath.   Cardiovascular: Negative for chest pain and leg swelling.  Gastrointestinal: Positive for abdominal pain, nausea and vomiting. Negative for diarrhea.  Genitourinary: Negative for dysuria.  Musculoskeletal: Negative for myalgias.  Skin: Negative for rash.  Neurological:  Negative for dizziness and headaches.    Physical Exam Updated Vital Signs BP (!) 132/102   Pulse 62   Temp 99 F (37.2 C) (Oral)   Resp 18   SpO2 99%   Physical Exam Vitals and nursing note reviewed.  Constitutional:      General: He is not in acute distress.    Comments: Pleasant well-appearing 24 year old.  In no acute distress.  Sitting comfortably in bed.  Able answer questions appropriately follow commands. No increased work of breathing. Speaking in full sentences. Hiccuping profusely  HENT:     Head: Normocephalic and atraumatic.     Nose: Nose normal.  Eyes:     General: No scleral icterus. Cardiovascular:     Rate and Rhythm: Normal rate and regular rhythm.     Pulses: Normal pulses.     Heart sounds: Normal heart sounds.  Pulmonary:     Effort: Pulmonary effort is normal. No respiratory distress.     Breath sounds: No wheezing.  Abdominal:     Palpations: Abdomen is soft.     Tenderness: There is no abdominal tenderness.     Comments: Flat soft nontender abdomen; no guarding, rebound or masses palpated.  Musculoskeletal:     Cervical back: Normal range of motion.     Right lower leg: No edema.     Left lower leg: No edema.  Skin:    General: Skin is warm and dry.     Capillary Refill: Capillary refill takes less than 2 seconds.  Neurological:     Mental Status: He is alert. Mental status is at baseline.  Psychiatric:        Mood and Affect: Mood normal.        Behavior: Behavior normal.     ED Results / Procedures / Treatments   Labs (all labs ordered are listed, but only abnormal results are displayed) Labs Reviewed  COMPREHENSIVE METABOLIC PANEL - Abnormal; Notable for the following components:      Result Value   Glucose, Bld 150 (*)    BUN 21 (*)    Total Protein 8.2 (*)    Albumin 5.1 (*)    Total Bilirubin 1.4 (*)    Anion gap 16 (*)    All other components within normal limits  CBC - Abnormal; Notable for the following components:   WBC  11.5 (*)    All other components within normal limits  LIPASE, BLOOD  URINALYSIS, ROUTINE W REFLEX MICROSCOPIC    EKG None  Radiology No results found.  Procedures Procedures (including critical care time)  Medications Ordered in ED Medications  ondansetron (ZOFRAN-ODT) disintegrating tablet 4 mg (4 mg Oral Given 08/27/20 1618)  dicyclomine (BENTYL) capsule 20 mg (20 mg Oral Given 08/28/20 0732)  famotidine (PEPCID) tablet 20 mg (20 mg Oral Given 08/28/20 0732)  ketorolac (TORADOL) 15 MG/ML injection 30 mg (30 mg Intramuscular Given 08/28/20 0732)  ondansetron (ZOFRAN-ODT) disintegrating tablet 4 mg (4  mg Oral Given 08/28/20 0731)    ED Course  I have reviewed the triage vital signs and the nursing notes.  Pertinent labs & imaging results that were available during my care of the patient were reviewed by me and considered in my medical decision making (see chart for details).    MDM Rules/Calculators/A&P                          Patient is 24 year old male with past medical history detailed in HPI presented today with epigastric abdominal pain, dyspepsia, hiccups after he dry swallowed 2 tablets of Alka-Seltzer and she has some of the small and of water.  Physical exam is unremarkable.  He has no tenderness on my physical exam.  Vital signs are within normal limits and he is not hypertensive apart from 1 elevated blood pressure reading which was found just prior to discharge 132/102  Lab work below CMP without significant electrolyte derangement.  Blood sugar mildly high at 150 BUN 21 consistent with very mild dehydration.  He is tolerating p.o. presently. CBC with WBC mildly elevated 11.5 likely secondary to patient's frequent episodes of emesis.  He is no longer vomiting.  Lipase within normal limits doubt pancreatitis. I offered patient IV fluids versus p.o. challenge and p.o. medicine and he prefer the latter.  Patient was given Zofran, Bentyl, Pepcid and 1 dose of  Toradol.  He feels significantly better at this time.  He did have a 16-hour wait before I evaluated him.  He states that he initially felt better after the first dose of Zofran however many hours later his symptoms returned.  On my repeat abdominal exam patient continues to have a soft nontender abdomen.  He is tolerating p.o. now.  Will discharge with Bentyl Zofran and Pepcid and follow-up with his primary care doctor as well as his gastroenterologist who he has not seen yet.  Final Clinical Impression(s) / ED Diagnoses Final diagnoses:  Epigastric pain    Rx / DC Orders ED Discharge Orders         Ordered    dicyclomine (BENTYL) 10 MG capsule  2 times daily PRN        08/28/20 0916    ondansetron (ZOFRAN ODT) 4 MG disintegrating tablet  Every 8 hours PRN        08/28/20 0916    famotidine (PEPCID) 20 MG tablet  Daily        08/28/20 0918           Gailen Shelter, PA 08/28/20 1001    Mancel Bale, MD 08/30/20 1640

## 2020-08-28 NOTE — ED Notes (Signed)
Pt unable to tolerate fluid challenge. Pt vomited x2 after taking pills and drinking water.

## 2020-08-29 ENCOUNTER — Telehealth: Payer: Self-pay

## 2020-08-29 NOTE — Telephone Encounter (Signed)
Transition Care Management Follow-up Telephone Call  Date of discharge and from where: 08/28/2020 [facility]  How have you been since you were released from the hospital? Started taking medications given at discharge, Patient stated after he took Pepcid and Zofran he started feeling tingling sensation in chest of some SOB. Advised patient to not take another dose until he is able to get in contact with PCP.  Patient was unable to confirm if Marcy Siren, DO is still his PCP. Advised patient to call them and let them know what is going on. Also advised patient to go back to ED if breathing gets worse.   Any questions or concerns? No  Items Reviewed:  Did the pt receive and understand the discharge instructions provided? Yes   Medications obtained and verified? Yes   Other? No   Any new allergies since your discharge? No   Dietary orders reviewed? Yes  Do you have support at home? Yes   Functional Questionnaire: (I = Independent and D = Dependent) ADLs: I  Bathing/Dressing- I  Meal Prep- I  Eating- I  Maintaining continence- I  Transferring/Ambulation- I  Managing Meds- I  Follow up appointments reviewed:   PCP Hospital f/u appt confirmed? No  Unable to confirm PCP.   Specialist Hospital f/u appt confirmed? No  .  Are transportation arrangements needed? No   If their condition worsens, is the pt aware to call PCP or go to the Emergency Dept.? Yes  Was the patient provided with contact information for the PCP's office or ED? Yes  Was to pt encouraged to call back with questions or concerns? Yes

## 2020-12-30 ENCOUNTER — Ambulatory Visit: Payer: Self-pay | Admitting: Nurse Practitioner

## 2021-02-12 ENCOUNTER — Encounter (HOSPITAL_COMMUNITY): Payer: Self-pay

## 2021-02-12 ENCOUNTER — Other Ambulatory Visit: Payer: Self-pay

## 2021-02-12 ENCOUNTER — Emergency Department (HOSPITAL_COMMUNITY): Payer: Medicaid Other

## 2021-02-12 ENCOUNTER — Emergency Department (HOSPITAL_COMMUNITY)
Admission: EM | Admit: 2021-02-12 | Discharge: 2021-02-12 | Disposition: A | Discharge status: Home / Self Care | Payer: Medicaid Other | Attending: Emergency Medicine | Admitting: Emergency Medicine

## 2021-02-12 DIAGNOSIS — R1110 Vomiting, unspecified: Secondary | ICD-10-CM | POA: Diagnosis not present

## 2021-02-12 DIAGNOSIS — R1111 Vomiting without nausea: Secondary | ICD-10-CM | POA: Diagnosis not present

## 2021-02-12 DIAGNOSIS — R1010 Upper abdominal pain, unspecified: Secondary | ICD-10-CM

## 2021-02-12 DIAGNOSIS — F17200 Nicotine dependence, unspecified, uncomplicated: Secondary | ICD-10-CM | POA: Insufficient documentation

## 2021-02-12 DIAGNOSIS — R109 Unspecified abdominal pain: Secondary | ICD-10-CM | POA: Insufficient documentation

## 2021-02-12 DIAGNOSIS — R55 Syncope and collapse: Secondary | ICD-10-CM | POA: Diagnosis not present

## 2021-02-12 DIAGNOSIS — K760 Fatty (change of) liver, not elsewhere classified: Secondary | ICD-10-CM | POA: Diagnosis not present

## 2021-02-12 DIAGNOSIS — R1084 Generalized abdominal pain: Secondary | ICD-10-CM | POA: Diagnosis not present

## 2021-02-12 LAB — URINALYSIS, ROUTINE W REFLEX MICROSCOPIC
Bilirubin Urine: NEGATIVE
Glucose, UA: NEGATIVE mg/dL
Hgb urine dipstick: NEGATIVE
Ketones, ur: 20 mg/dL — AB
Leukocytes,Ua: NEGATIVE
Nitrite: NEGATIVE
Protein, ur: 100 mg/dL — AB
Specific Gravity, Urine: 1.035 — ABNORMAL HIGH (ref 1.005–1.030)
pH: 8 (ref 5.0–8.0)

## 2021-02-12 LAB — COMPREHENSIVE METABOLIC PANEL
ALT: 19 U/L (ref 0–44)
AST: 29 U/L (ref 15–41)
Albumin: 5.2 g/dL — ABNORMAL HIGH (ref 3.5–5.0)
Alkaline Phosphatase: 48 U/L (ref 38–126)
Anion gap: 12 (ref 5–15)
BUN: 17 mg/dL (ref 6–20)
CO2: 21 mmol/L — ABNORMAL LOW (ref 22–32)
Calcium: 9.8 mg/dL (ref 8.9–10.3)
Chloride: 106 mmol/L (ref 98–111)
Creatinine, Ser: 1.01 mg/dL (ref 0.61–1.24)
GFR, Estimated: >60 mL/min (ref >60)
Glucose, Bld: 171 mg/dL — ABNORMAL HIGH (ref 70–99)
Potassium: 3.2 mmol/L — ABNORMAL LOW (ref 3.5–5.1)
Sodium: 139 mmol/L (ref 135–145)
Total Bilirubin: 3.3 mg/dL — ABNORMAL HIGH (ref 0.3–1.2)
Total Protein: 8.4 g/dL — ABNORMAL HIGH (ref 6.5–8.1)

## 2021-02-12 LAB — LIPASE, BLOOD: Lipase: 22 U/L (ref 11–51)

## 2021-02-12 LAB — HEPATITIS PANEL, ACUTE
HCV Ab: NONREACTIVE
Hep A IgM: NONREACTIVE
Hep B C IgM: NONREACTIVE
Hepatitis B Surface Ag: NONREACTIVE

## 2021-02-12 LAB — CBC
HCT: 42.6 % (ref 39.0–52.0)
Hemoglobin: 14.9 g/dL (ref 13.0–17.0)
MCH: 32.3 pg (ref 26.0–34.0)
MCHC: 35 g/dL (ref 30.0–36.0)
MCV: 92.4 fL (ref 80.0–100.0)
Platelets: 232 10*3/uL (ref 150–400)
RBC: 4.61 MIL/uL (ref 4.22–5.81)
RDW: 11.9 % (ref 11.5–15.5)
WBC: 14 10*3/uL — ABNORMAL HIGH (ref 4.0–10.5)
nRBC: 0 % (ref 0.0–0.2)

## 2021-02-12 MED ORDER — ONDANSETRON 4 MG PO TBDP: ORAL_TABLET | ORAL | 0 refills | Status: DC

## 2021-02-12 MED ORDER — IOHEXOL 300 MG/ML  SOLN
100.0000 mL | Freq: Once | INTRAMUSCULAR | Status: AC | PRN
  Administered 2021-02-12: 11:00:00 100 mL via INTRAVENOUS

## 2021-02-12 MED ORDER — ONDANSETRON HCL 4 MG/2ML IJ SOLN
4.0000 mg | Freq: Once | INTRAMUSCULAR | Status: AC
  Administered 2021-02-12: 10:00:00 4 mg via INTRAVENOUS
  Filled 2021-02-12: qty 2

## 2021-02-12 MED ORDER — ONDANSETRON 4 MG PO TBDP
4.0000 mg | ORAL_TABLET | Freq: Once | ORAL | Status: AC | PRN
  Administered 2021-02-12: 04:00:00 4 mg via ORAL
  Filled 2021-02-12: qty 1

## 2021-02-12 MED ORDER — DICYCLOMINE HCL 20 MG PO TABS: ORAL_TABLET | ORAL | 0 refills | Status: DC

## 2021-02-12 MED ORDER — HYDROMORPHONE HCL 1 MG/ML IJ SOLN
0.5000 mg | Freq: Once | INTRAMUSCULAR | Status: AC
  Administered 2021-02-12: 10:00:00 0.5 mg via INTRAVENOUS
  Filled 2021-02-12: qty 1

## 2021-02-12 MED ORDER — SODIUM CHLORIDE (PF) 0.9 % IJ SOLN
INTRAMUSCULAR | Status: AC
  Filled 2021-02-12: qty 50

## 2021-02-12 MED ORDER — SODIUM CHLORIDE 0.9 % IV BOLUS
1000.0000 mL | Freq: Once | INTRAVENOUS | Status: AC
  Administered 2021-02-12: 10:00:00 1000 mL via INTRAVENOUS

## 2021-02-12 NOTE — ED Notes (Signed)
PT to CT at this time.

## 2021-02-12 NOTE — ED Notes (Signed)
Provider at the bedside.  

## 2021-02-12 NOTE — ED Provider Notes (Signed)
[facility]-EMERGENCY DEPT Provider Note   CSN: [id number] Arrival date & time: 02/12/21  0342     History Chief Complaint  Patient presents with   Emesis    Douglas Mckinney is a 25 y.o. male.  Patient with abdominal pain and vomiting.  No fever no chills no cough  The history is provided by the patient and medical records. No language interpreter was used.  Emesis Severity:  Mild Duration: 3 days. 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 No pertinent surgical history.     Family History  Problem Relation Age of Onset   Healthy Mother    Healthy Father     Social History   Tobacco Use   Smoking status: Some Days    Pack years: 0.00   Smokeless tobacco: Never  Substance Use Topics   Alcohol use: No   Drug use: Yes    Types: Marijuana    Home Medications Prior to Admission medications   Medication Sig Start Date End Date Taking? Authorizing Provider  dicyclomine (BENTYL) 20 MG tablet Take 1 every 8-12 hours for abdominal cramping 02/12/21  Yes Bethann Berkshire, MD  ondansetron (ZOFRAN ODT) 4 MG disintegrating tablet 4mg  ODT q4 hours prn nausea/vomit 02/12/21  Yes 02/14/21, MD  famotidine (PEPCID) 20 MG tablet Take 1 tablet (20 mg total) by mouth daily for 14 days. 08/28/20 09/11/20  11/09/20, PA  ibuprofen (ADVIL) 200 MG tablet Take 400 mg by mouth every 6 (six) hours as needed for mild pain.    [provider]   Phenyleph-Doxyl-DM-Aspirin (ALKA-SELTZER PLUS COLD DY/NGHT PO) Take 2 tablets by mouth every 6 (six) hours as needed (cold symptoms).    [provider]  omeprazole (PRILOSEC) 20 MG capsule Take 1 capsule (20 mg total) by mouth daily. 05/19/19 07/31/19  08/02/19, NP  sucralfate (CARAFATE) 1 GM/10ML suspension Take 10 mLs (1 g total) by mouth 4 (four) times daily -  with meals and at bedtime. 08/17/18 05/19/19  [place], 05/21/19, PA-C    Allergies    Patient has no known allergies.  Review of Systems   Review of Systems  Constitutional:  Negative for appetite change and fatigue.  HENT:  Negative for congestion, ear discharge and sinus pressure.   Eyes:  Negative for discharge.  Respiratory:  Negative for cough.   Cardiovascular:  Negative for chest pain.  Gastrointestinal:  Positive for abdominal pain and vomiting. Negative for diarrhea.  Genitourinary:  Negative for frequency and hematuria.  Musculoskeletal:  Negative for back pain.  Skin:  Negative for rash.  Neurological:  Negative for seizures and headaches.  Psychiatric/Behavioral:  Negative for hallucinations.    Physical Exam Updated Vital Signs BP 130/63 (BP Location: Left Arm)   Pulse 61   Temp 98.7 F (37.1 C) (Oral)   Resp 14   Ht 5\' 7"  (1.702 m)   Wt 54.4 kg   SpO2 100%   BMI 18.79  kg/m   Physical Exam Vitals and nursing note reviewed.  Constitutional:      Appearance: He is well-developed.  HENT:     Head: Normocephalic.     Mouth/Throat:     Mouth: Mucous membranes are moist.  Eyes:     General: No scleral icterus.    Conjunctiva/sclera: Conjunctivae normal.  Neck:     Thyroid: No thyromegaly.  Cardiovascular:     Rate and Rhythm: Normal rate and regular rhythm.     Heart sounds: No murmur heard.   No friction rub. No gallop.  Pulmonary:     Breath sounds: No stridor. No wheezing or rales.  Chest:     Chest wall: No tenderness.  Abdominal:     General: There is no  distension.     Tenderness: There is abdominal tenderness. There is no rebound.  Musculoskeletal:        General: Normal range of motion.     Cervical back: Neck supple.  Lymphadenopathy:     Cervical: No cervical adenopathy.  Skin:    Findings: No erythema or rash.  Neurological:     Mental Status: He is alert and oriented to person, place, and time.     Motor: No abnormal muscle tone.     Coordination: Coordination normal.  Psychiatric:        Behavior: Behavior normal.    ED Results / Procedures / Treatments   Labs (all labs ordered are listed, but only abnormal results are displayed) Labs Reviewed  COMPREHENSIVE METABOLIC PANEL - Abnormal; Notable for the following components:      Result Value   Potassium 3.2 (*)    CO2 21 (*)    Glucose, Bld 171 (*)    Total Protein 8.4 (*)    Albumin 5.2 (*)    Total Bilirubin 3.3 (*)    All other components within normal limits  CBC - Abnormal; Notable for the following components:   WBC 14.0 (*)    All other components within normal limits  URINALYSIS, ROUTINE W REFLEX MICROSCOPIC - Abnormal; Notable for the following components:   APPearance HAZY (*)    Specific Gravity, Urine 1.035 (*)    Ketones, ur 20 (*)    Protein, ur 100 (*)    Bacteria, UA RARE (*)    All other components within normal limits  LIPASE, BLOOD  HEPATITIS PANEL, ACUTE    EKG None  Radiology CT ABDOMEN PELVIS W CONTRAST  Result Date: 02/12/2021 CLINICAL DATA:  Abdominal pain with nausea and vomiting EXAM: CT ABDOMEN AND PELVIS WITH CONTRAST TECHNIQUE: Multidetector CT imaging of the abdomen and pelvis was performed using the standard protocol following bolus administration of intravenous contrast. CONTRAST:  OMNIPAQUE IOHEXOL 300 MG/ML  SOLN COMPARISON:  None. FINDINGS: Lower chest: Lung bases are clear. Hepatobiliary: There is hepatic steatosis. No focal liver lesions are appreciable. There is a a degree of periportal edema. No focal liver lesions  are evident. The gallbladder wall is not appreciably thickened. There is no biliary duct dilatation. Pancreas: There is no pancreatic mass or inflammatory focus. Spleen: No splenic lesions are evident. Adrenals/Urinary Tract: Adrenals bilaterally appear unremarkable. There is no appreciable renal mass or hydronephrosis on either side. No evident renal or ureteral calculus on either side. Urinary bladder is midline with wall thickness within normal limits. Stomach/Bowel: No appreciable bowel wall thickening or bowel obstruction. Terminal ileum appears normal. No periappendiceal region inflammation evident. No evident free air or portal venous air. Vascular/Lymphatic:  No arterial vascular lesions are evident. Major venous structures appear patent. No evident adenopathy in the abdomen or pelvis. Reproductive: Prostate and seminal vesicles normal in size and contour. Other: No evident abscess or ascites in the abdomen or pelvis. Musculoskeletal: No blastic or lytic bone lesions. No abdominal wall or intramuscular lesions are appreciable. IMPRESSION: 1. Evidence of hepatic steatosis. There is apparent periportal edema. Etiology uncertain. Advise appropriate laboratory correlation to assess for potential parenchymal liver disease which could account for this finding. 2. No bowel wall thickening or bowel obstruction. No appreciable abscess in the abdomen or pelvis. No periappendiceal region inflammation evident. 3. No evident renal or ureteral calculus. No hydronephrosis. Urinary bladder wall thickness within normal limits. Electronically Signed   By: Bretta Bang III M.D.   On: 02/12/2021 11:18    Procedures Procedures   Medications Ordered in ED Medications  sodium chloride (PF) 0.9 % injection (  Not Given 02/12/21 1039)  ondansetron (ZOFRAN-ODT) disintegrating tablet 4 mg (4 mg Oral Given 02/12/21 0420)  sodium chloride 0.9 % bolus 1,000 mL (1,000 mLs Intravenous New Bag/Given 02/12/21 0938)  ondansetron  (ZOFRAN) injection 4 mg (4 mg Intravenous Given 02/12/21 0939)  HYDROmorphone (DILAUDID) injection 0.5 mg (0.5 mg Intravenous Given 02/12/21 0940)  iohexol (OMNIPAQUE) 300 MG/ML solution 100 mL (100 mLs Intravenous Contrast Given 02/12/21 1047)    ED Course  I have reviewed the triage vital signs and the nursing notes.  Pertinent labs & imaging results that were available during my care of the patient were reviewed by me and considered in my medical decision making (see chart for details).    MDM Rules/Calculators/A&P                        Patient with abdominal pain and vomiting.  Labs show elevated T bili with periportal edema.  I spoke with [facility] and they recommended getting viral hepatitis profile and they will follow-up in the next week Final Clinical Impression(s) / ED Diagnoses Final diagnoses:  None    Rx / DC Orders ED Discharge Orders          Ordered    dicyclomine (BENTYL) 20 MG tablet        02/12/21 1240    ondansetron (ZOFRAN ODT) 4 MG disintegrating tablet        02/12/21 1240             Bethann Berkshire, MD 02/17/21 1715

## 2021-02-12 NOTE — ED Triage Notes (Signed)
Pt c/o n/v/d and midline abd pain starting yesterday evening. Denies fevers or sick exposure

## 2021-02-12 NOTE — ED Notes (Signed)
Pt attached to cardiac monitor x3. A&O x4.VSS. 20g peripheral IV started in the right AC.

## 2021-02-12 NOTE — ED Notes (Signed)
Pt gone to CT unable to update his vitals at the moment

## 2021-02-12 NOTE — Discharge Instructions (Addendum)
Drink plenty of fluids and follow-up with [facility] gastroenterology next week.

## 2021-02-13 ENCOUNTER — Telehealth: Payer: Self-pay | Admitting: Nurse Practitioner

## 2021-02-13 ENCOUNTER — Telehealth: Payer: Self-pay

## 2021-02-13 NOTE — Telephone Encounter (Signed)
Transition Care Management Unsuccessful Follow-up Telephone Call  Date of discharge and from where:  02/12/21 from [facility]  Attempts:  1st Attempt  Reason for unsuccessful TCM follow-up call:  Left voice message

## 2021-02-13 NOTE — Telephone Encounter (Signed)
Pt was called Pt informed me that he already has a appointment Monday 02/17/21 with a Manufacturing engineer

## 2021-02-14 NOTE — Telephone Encounter (Signed)
Transition Care Management Unsuccessful Follow-up Telephone Call  Date of discharge and from where:  02/12/2021 - [facility]   Attempts:  2nd Attempt  Reason for unsuccessful TCM follow-up call:  Left voice message

## 2021-02-17 NOTE — Telephone Encounter (Signed)
Transition Care Management Follow-up Telephone Call Date of discharge and from where: 02/12/2021 from [facility] How have you been since you were released from the hospital? Pt states that he is feeling better and has no questions or concerns at this time.  Any questions or concerns? No  Items Reviewed: Did the pt receive and understand the discharge instructions provided? Yes  Medications obtained and verified? Yes  Other? No  Any new allergies since your discharge? No  Dietary orders reviewed? No Do you have support at home? Yes   Functional Questionnaire: (I = Independent and D = Dependent) ADLs: I  Bathing/Dressing- I  Meal Prep- I  Eating- I  Maintaining continence- I  Transferring/Ambulation- I  Managing Meds- I   Follow up appointments reviewed:  PCP Hospital f/u appt confirmed? Yes  Hoy Register, MD establishing on 03/27/21 at [facility] f/u appt confirmed? Yes  Scheduled to see Gastrology on 02/21/21. Are transportation arrangements needed? No  If their condition worsens, is the pt aware to call PCP or go to the Emergency Dept.? Yes Was the patient provided with contact information for the PCP's office or ED? Yes Was to pt encouraged to call back with questions or concerns? Yes

## 2021-03-07 ENCOUNTER — Other Ambulatory Visit: Payer: Self-pay | Admitting: Physician Assistant

## 2021-03-07 DIAGNOSIS — R1013 Epigastric pain: Secondary | ICD-10-CM

## 2021-03-07 DIAGNOSIS — R634 Abnormal weight loss: Secondary | ICD-10-CM | POA: Diagnosis not present

## 2021-03-07 DIAGNOSIS — K639 Disease of intestine, unspecified: Secondary | ICD-10-CM | POA: Diagnosis not present

## 2021-03-07 DIAGNOSIS — D72829 Elevated white blood cell count, unspecified: Secondary | ICD-10-CM | POA: Diagnosis not present

## 2021-03-07 DIAGNOSIS — R17 Unspecified jaundice: Secondary | ICD-10-CM | POA: Diagnosis not present

## 2021-03-07 DIAGNOSIS — E876 Hypokalemia: Secondary | ICD-10-CM | POA: Diagnosis not present

## 2021-03-07 DIAGNOSIS — R1011 Right upper quadrant pain: Secondary | ICD-10-CM

## 2021-03-24 ENCOUNTER — Other Ambulatory Visit: Payer: Medicaid Other

## 2021-03-26 ENCOUNTER — Telehealth: Payer: Self-pay | Admitting: Family Medicine

## 2021-03-26 NOTE — Telephone Encounter (Signed)
Called to inform pt that provider will be virtual

## 2021-03-27 ENCOUNTER — Other Ambulatory Visit: Payer: Self-pay

## 2021-03-27 ENCOUNTER — Encounter: Payer: Self-pay | Admitting: Family Medicine

## 2021-03-27 ENCOUNTER — Ambulatory Visit: Payer: Medicaid Other | Attending: Family Medicine | Admitting: Family Medicine

## 2021-03-27 DIAGNOSIS — K5909 Other constipation: Secondary | ICD-10-CM | POA: Diagnosis not present

## 2021-03-27 DIAGNOSIS — K219 Gastro-esophageal reflux disease without esophagitis: Secondary | ICD-10-CM | POA: Diagnosis not present

## 2021-03-27 MED ORDER — POLYETHYLENE GLYCOL 3350 17 GM/SCOOP PO POWD
17.0000 g | Freq: Every day | ORAL | 1 refills | Status: DC
Start: 2021-03-27 — End: 2023-09-24

## 2021-03-27 NOTE — Progress Notes (Signed)
Virtual Visit via Telephone Note  I connected with Douglas Mckinney, on 03/27/2021 at 8:55 AM by telephone due to the COVID-19 pandemic and verified that I am speaking with the correct person using two identifiers.   Consent: I discussed the limitations, risks, security and privacy concerns of performing an evaluation and management service by telephone and the availability of in person appointments. I also discussed with the patient that there may be a patient responsible charge related to this service. The patient expressed understanding and agreed to proceed.   Location of Patient: Work  Government social research officer of Provider: Clinic   Persons participating in Telemedicine visit: Douglas Mckinney Dr. Alvis Lemmings     History of Present Illness: 25 year old Male with a history of GERD seen for an office visit.  He was treated at the emergency room last month for a flareup of his GERD.  Currently uses Pepcid as needed. Certain foods like acidic foods, spicy foods trigger his symptoms but at the moment his symptoms are controlled.   Complains of constipation which is chronic; he moves his bowels twice a week.  Denies presence of hematochezia. 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 14 tablet 0   ibuprofen (ADVIL) 200 MG tablet Take 400 mg by mouth every 6 (six) hours as needed for mild pain.     ondansetron (ZOFRAN ODT) 4 MG disintegrating tablet 4mg  ODT q4 hours prn nausea/vomit 12 tablet 0   Phenyleph-Doxyl-DM-Aspirin (ALKA-SELTZER PLUS COLD DY/NGHT PO) Take 2 tablets by mouth every 6 (six) hours as needed (cold symptoms).     [DISCONTINUED]  omeprazole (PRILOSEC) 20 MG capsule Take 1 capsule (20 mg total) by mouth daily. 30 capsule 0   [DISCONTINUED] sucralfate (CARAFATE) 1 GM/10ML suspension Take 10 mLs (1 g total) by mouth 4 (four) times daily -  with meals and at bedtime. 60 mL 0   No current facility-administered medications on file prior to visit.    ROS: See HPI_  Observations/Objective: Awake, alert, oriented x3 Not in acute distress Normal  Assessment and Plan: 1. Gastroesophageal reflux disease without esophagitis Avoid foods that trigger Avoid recumbency up to 2 hours post meals Continue famotidine as needed  2. Other constipation Counseled on increasing fiber intake, increasing water intake Discussed foods that could trigger constipation including white bread, cheese - polyethylene glycol powder (GLYCOLAX/MIRALAX) 17 GM/SCOOP powder; Take 17 g by mouth daily.  Dispense: 3350 g; Refill: 1   Follow Up Instructions: He will follow-up in 3 months to get established at Primary care [facility]   I discussed the assessment and treatment plan with the patient. The patient was provided an opportunity to ask questions and all were answered. The patient agreed with the plan and demonstrated an understanding of the instructions.   The patient was advised to call back or seek an in-person evaluation if the symptoms worsen or if the condition fails to improve as anticipated.     I provided 11 minutes total of non-face-to-face time during this encounter.   [facility], MD, FAAFP. [facility] [place], [place]   03/27/2021, 8:55 AM

## 2021-05-09 ENCOUNTER — Ambulatory Visit: Payer: Medicaid Other | Admitting: Family Medicine

## 2021-05-28 ENCOUNTER — Ambulatory Visit: Payer: Medicaid Other | Admitting: Family Medicine

## 2021-12-11 ENCOUNTER — Emergency Department (HOSPITAL_COMMUNITY): Payer: Medicaid Other

## 2021-12-11 ENCOUNTER — Emergency Department (HOSPITAL_COMMUNITY)
Admission: EM | Admit: 2021-12-11 | Discharge: 2021-12-11 | Disposition: A | Discharge status: Home / Self Care | Payer: Medicaid Other | Attending: Emergency Medicine | Admitting: Emergency Medicine

## 2021-12-11 ENCOUNTER — Encounter (HOSPITAL_COMMUNITY): Payer: Self-pay

## 2021-12-11 DIAGNOSIS — R0602 Shortness of breath: Secondary | ICD-10-CM | POA: Insufficient documentation

## 2021-12-11 DIAGNOSIS — R112 Nausea with vomiting, unspecified: Secondary | ICD-10-CM | POA: Diagnosis not present

## 2021-12-11 DIAGNOSIS — R739 Hyperglycemia, unspecified: Secondary | ICD-10-CM | POA: Insufficient documentation

## 2021-12-11 DIAGNOSIS — Z20822 Contact with and (suspected) exposure to covid-19: Secondary | ICD-10-CM | POA: Insufficient documentation

## 2021-12-11 DIAGNOSIS — R1110 Vomiting, unspecified: Secondary | ICD-10-CM | POA: Diagnosis not present

## 2021-12-11 DIAGNOSIS — R1084 Generalized abdominal pain: Secondary | ICD-10-CM | POA: Insufficient documentation

## 2021-12-11 DIAGNOSIS — Z79899 Other long term (current) drug therapy: Secondary | ICD-10-CM | POA: Insufficient documentation

## 2021-12-11 DIAGNOSIS — R1013 Epigastric pain: Secondary | ICD-10-CM | POA: Diagnosis not present

## 2021-12-11 DIAGNOSIS — D72829 Elevated white blood cell count, unspecified: Secondary | ICD-10-CM | POA: Insufficient documentation

## 2021-12-11 DIAGNOSIS — E876 Hypokalemia: Secondary | ICD-10-CM | POA: Insufficient documentation

## 2021-12-11 LAB — COMPREHENSIVE METABOLIC PANEL
ALT: 21 U/L (ref 0–44)
AST: 34 U/L (ref 15–41)
Albumin: 4.9 g/dL (ref 3.5–5.0)
Alkaline Phosphatase: 52 U/L (ref 38–126)
Anion gap: 11 (ref 5–15)
BUN: 13 mg/dL (ref 6–20)
CO2: 22 mmol/L (ref 22–32)
Calcium: 9.8 mg/dL (ref 8.9–10.3)
Chloride: 108 mmol/L (ref 98–111)
Creatinine, Ser: 0.92 mg/dL (ref 0.61–1.24)
GFR, Estimated: >60 mL/min (ref >60)
Glucose, Bld: 165 mg/dL — ABNORMAL HIGH (ref 70–99)
Potassium: 3.4 mmol/L — ABNORMAL LOW (ref 3.5–5.1)
Sodium: 141 mmol/L (ref 135–145)
Total Bilirubin: 1 mg/dL (ref 0.3–1.2)
Total Protein: 8 g/dL (ref 6.5–8.1)

## 2021-12-11 LAB — RESP PANEL BY RT-PCR (FLU A&B, COVID) ARPGX2
Influenza A by PCR: NEGATIVE
Influenza B by PCR: NEGATIVE
SARS Coronavirus 2 by RT PCR: NEGATIVE

## 2021-12-11 LAB — CBC WITH DIFFERENTIAL/PLATELET
Abs Immature Granulocytes: 0.09 10*3/uL — ABNORMAL HIGH (ref 0.00–0.07)
Basophils Absolute: 0 10*3/uL (ref 0.0–0.1)
Basophils Relative: 0 %
Eosinophils Absolute: 0 10*3/uL (ref 0.0–0.5)
Eosinophils Relative: 0 %
HCT: 40.5 % (ref 39.0–52.0)
Hemoglobin: 14 g/dL (ref 13.0–17.0)
Immature Granulocytes: 1 %
Lymphocytes Relative: 6 %
Lymphs Abs: 1 10*3/uL (ref 0.7–4.0)
MCH: 32.3 pg (ref 26.0–34.0)
MCHC: 34.6 g/dL (ref 30.0–36.0)
MCV: 93.3 fL (ref 80.0–100.0)
Monocytes Absolute: 0.8 10*3/uL (ref 0.1–1.0)
Monocytes Relative: 5 %
Neutro Abs: 15.1 10*3/uL — ABNORMAL HIGH (ref 1.7–7.7)
Neutrophils Relative %: 88 %
Platelets: 258 10*3/uL (ref 150–400)
RBC: 4.34 MIL/uL (ref 4.22–5.81)
RDW: 11.6 % (ref 11.5–15.5)
WBC: 17 10*3/uL — ABNORMAL HIGH (ref 4.0–10.5)
nRBC: 0 % (ref 0.0–0.2)

## 2021-12-11 LAB — RAPID URINE DRUG SCREEN, HOSP PERFORMED
Amphetamines: NOT DETECTED
Barbiturates: NOT DETECTED
Benzodiazepines: NOT DETECTED
Cocaine: NOT DETECTED
Opiates: NOT DETECTED
Tetrahydrocannabinol: POSITIVE — AB

## 2021-12-11 LAB — LIPASE, BLOOD: Lipase: 28 U/L (ref 11–51)

## 2021-12-11 MED ORDER — FAMOTIDINE IN NACL 20-0.9 MG/50ML-% IV SOLN: 20.0000 mg | Freq: Once | INTRAVENOUS | Status: DC

## 2021-12-11 MED ORDER — LORAZEPAM 2 MG/ML IJ SOLN
1.0000 mg | Freq: Once | INTRAMUSCULAR | Status: AC
  Administered 2021-12-11: 1 mg via INTRAVENOUS
  Filled 2021-12-11: qty 1

## 2021-12-11 MED ORDER — ONDANSETRON HCL 4 MG/2ML IJ SOLN
4.0000 mg | Freq: Once | INTRAMUSCULAR | Status: AC
  Administered 2021-12-11: 4 mg via INTRAVENOUS
  Filled 2021-12-11: qty 2

## 2021-12-11 MED ORDER — SODIUM CHLORIDE 0.9 % IV BOLUS
1000.0000 mL | Freq: Once | INTRAVENOUS | Status: AC
  Administered 2021-12-11: 1000 mL via INTRAVENOUS

## 2021-12-11 MED ORDER — ONDANSETRON 8 MG PO TBDP: 8.0000 mg | ORAL_TABLET | Freq: Three times a day (TID) | ORAL | 0 refills | Status: DC | PRN

## 2021-12-11 MED ORDER — DICYCLOMINE HCL 20 MG PO TABS: 20.0000 mg | ORAL_TABLET | Freq: Two times a day (BID) | ORAL | 0 refills | Status: DC

## 2021-12-11 MED ORDER — KETOROLAC TROMETHAMINE 30 MG/ML IJ SOLN
30.0000 mg | Freq: Once | INTRAMUSCULAR | Status: AC
  Administered 2021-12-11: 30 mg via INTRAVENOUS
  Filled 2021-12-11: qty 1

## 2021-12-11 NOTE — ED Notes (Signed)
Patient given a beverage for PO challenge.  ?

## 2021-12-11 NOTE — ED Triage Notes (Signed)
Pt presents with c/o vomiting that started this morning. Pt also c/o shortness of breath.  ?

## 2021-12-11 NOTE — ED Provider Triage Note (Signed)
Emergency Medicine Provider Triage Evaluation Note ? ?Douglas Mckinney , a 26 y.o. male  was evaluated in triage.  Pt complains of nausea/vomiting and shortness of breath since this morning.  Admits to frequent marijuana use, admits that he also smoked this morning.  Does not think that it could have been laced, as he said he has been smoking the same batch for the last week.  Vomited 4-5 times.  Endorses mild abdominal pain.  Denies constipation or diarrhea.  Denies fever or chills.  Denies recent illness or recent sick contacts.  Patient appears groggy but is overall alert and oriented x3.  Appears to be falling asleep intermittently and waking up with the complaint that something is hurting.  Denies urinary symptoms.  Starts he has not taken any other nonprescribed medications or recreational substances other than the marijuana. ? ?Review of Systems  ?Positive: As above ?Negative: As above ? ?Physical Exam  ?BP 110/65 (BP Location: Left Arm)   Pulse 96   Temp (!) 97.5 ?F (36.4 ?C) (Oral)   Resp 18   SpO2 100%  ?Gen:   Awake but groggy and drowsy, no acute distress   ?Resp:  Normal effort, intermittent bradypnea, CTAB ?MSK:   Moves extremities with some difficulty, pt appears to put forth significant effort to raise extremities ?Other:  Intermittent tachycardia appears to correlate with perceived random episodes of pain, pulses of upper and lower extremities 2+ bilaterally, lower abdominal tenderness without flank pain; subjective numbness inconsistent on exam ? ?Medical Decision Making  ?Medically screening exam initiated at 12:20 PM.  Appropriate orders placed.  Chief Roubal was informed that the remainder of the evaluation will be completed by another provider, this initial triage assessment does not replace that evaluation, and the importance of remaining in the ED until their evaluation is complete. ? ?Labs, imaging, EKG ordered ?  ?Prince Rome, PA-C ?[id number] ? ?

## 2021-12-11 NOTE — Discharge Instructions (Addendum)
Take the Zofran every 8 hours for nausea and vomiting.  Continue taking your Pepcid at home.  You can take the Bentyl for abdominal pain every 12 hours.  Work towards stopping marijuana use as I think that is aggravating her vomiting and abdominal pain.   ?

## 2021-12-11 NOTE — ED Provider Notes (Signed)
?[facility]-EMERGENCY DEPT ?Provider Note ? ? ?CSN: [id number] ?Arrival date & time: 12/11/21  1156 ? ?  ? ?History ? ?Chief Complaint  ?Patient presents with  ? Emesis  ? Shortness of Breath  ? ? ?Douglas Mckinney is a 26 y.o. male. ? ?The history is provided by the patient.  ?Emesis ?Shortness of Breath ?Associated symptoms: vomiting   ? ?Patient with medical history notable for allergic rhinitis presents today due to shortness of breath and emesis x1 day.  Patient states it started out of "nowhere this morning" associated with upper abdominal pain which radiates to his back.  He is also feeling short of breath but denies any specific chest pain.  The abdominal pain is worsened by emesis, no hematemesis.  Denies any previous abdominal surgeries.  Patient does state he smokes marijuana daily and occasionally vapes. ? ?Home Medications ?Prior to Admission medications   ?Medication Sig Start Date End Date Taking? Authorizing Provider  ?dicyclomine (BENTYL) 20 MG tablet Take 1 tablet (20 mg total) by mouth 2 (two) times daily. 12/11/21  Yes Theron Arista, PA-C  ?ondansetron (ZOFRAN-ODT) 8 MG disintegrating tablet Take 1 tablet (8 mg total) by mouth every 8 (eight) hours as needed for nausea or vomiting. 12/11/21  Yes Theron Arista, PA-C  ?famotidine (PEPCID) 20 MG tablet Take 1 tablet (20 mg total) by mouth daily for 14 days. 08/28/20 09/11/20  Gailen Shelter, PA  ?ibuprofen (ADVIL) 200 MG tablet Take 400 mg by mouth every 6 (six) hours as needed for mild pain.    [provider]  ?Phenyleph-Doxyl-DM-Aspirin (ALKA-SELTZER PLUS COLD DY/NGHT PO) Take 2 tablets by mouth every 6 (six) hours as needed (cold symptoms).    [provider]  ?polyethylene glycol powder (GLYCOLAX/MIRALAX) 17 GM/SCOOP powder Take 17 g by mouth daily. 03/27/21   Hoy Register, MD  ?omeprazole (PRILOSEC) 20 MG capsule Take 1 capsule (20 mg total) by mouth daily. 05/19/19 07/31/19  Georgetta Haber, NP  ?sucralfate  (CARAFATE) 1 GM/10ML suspension Take 10 mLs (1 g total) by mouth 4 (four) times daily -  with meals and at bedtime. 08/17/18 05/19/19  Dowless, Lester Kinsman, PA-C  ?   ? ?Allergies    ?Patient has no known allergies.   ? ?Review of Systems   ?Review of Systems  ?Respiratory:  Positive for shortness of breath.   ?Gastrointestinal:  Positive for vomiting.  ? ?Physical Exam ?Updated Vital Signs ?BP (!) 108/51   Pulse 76   Temp (!) 97.5 ?F (36.4 ?C) (Oral)   Resp 14   SpO2 100%  ?Physical Exam ?Vitals and nursing note reviewed. Exam conducted with a chaperone present.  ?Constitutional:   ?   Appearance: Normal appearance.  ?   Comments: Patient is actively vomiting into an emesis bag  ?HENT:  ?   Head: Normocephalic and atraumatic.  ?Eyes:  ?   General: No scleral icterus.    ?   Right eye: No discharge.     ?   Left eye: No discharge.  ?   Extraocular Movements: Extraocular movements intact.  ?   Pupils: Pupils are equal, round, and reactive to light.  ?Cardiovascular:  ?   Rate and Rhythm: Normal rate and regular rhythm.  ?   Pulses: Normal pulses.  ?   Heart sounds: Normal heart sounds. No murmur heard. ?  No friction rub. No gallop.  ?Pulmonary:  ?   Effort: Pulmonary effort is normal. No respiratory distress.  ?  Breath sounds: Normal breath sounds.  ?   Comments: Lungs are clear to auscultation bilaterally ?Abdominal:  ?   General: Abdomen is flat. Bowel sounds are normal. There is no distension.  ?   Palpations: Abdomen is soft.  ?   Tenderness: There is no abdominal tenderness.  ?Skin: ?   General: Skin is warm and dry.  ?   Coloration: Skin is not jaundiced.  ?Neurological:  ?   Mental Status: He is alert. Mental status is at baseline.  ?   Coordination: Coordination normal.  ? ? ?ED Results / Procedures / Treatments   ?Labs ?(all labs ordered are listed, but only abnormal results are displayed) ?Labs Reviewed  ?CBC WITH DIFFERENTIAL/PLATELET - Abnormal; Notable for the following components:  ?    Result  Value  ? WBC 17.0 (*)   ? Neutro Abs 15.1 (*)   ? Abs Immature Granulocytes 0.09 (*)   ? All other components within normal limits  ?COMPREHENSIVE METABOLIC PANEL - Abnormal; Notable for the following components:  ? Potassium 3.4 (*)   ? Glucose, Bld 165 (*)   ? All other components within normal limits  ?RAPID URINE DRUG SCREEN, HOSP PERFORMED - Abnormal; Notable for the following components:  ? Tetrahydrocannabinol POSITIVE (*)   ? All other components within normal limits  ?RESP PANEL BY RT-PCR (FLU A&B, COVID) ARPGX2  ?LIPASE, BLOOD  ? ? ?EKG ?EKG Interpretation ? ?Date/Time:  Thursday December 11 2021 15:39:56 EDT ?Ventricular Rate:  73 ?PR Interval:  147 ?QRS Duration: 93 ?QT Interval:  416 ?QTC Calculation: 409 ?R Axis:   82 ?Text Interpretation: Sinus rhythm Atrial premature complexes in couplets RSR' in V1 or V2, right VCD or RVH 12 Lead; Mason-Likar Since last tracing of earlier today No significant change was found Confirmed by Mancel BaleWentz, Elliott [phone]([id number]) on 12/11/2021 4:33:43 PM ? ?Radiology ?DG Chest Port 1 View ? ?Result Date: 12/11/2021 ?CLINICAL DATA:  Shortness of breath, vomiting EXAM: PORTABLE CHEST 1 VIEW COMPARISON:  None. FINDINGS: The heart size and mediastinal contours are within normal limits. Both lungs are clear. The visualized skeletal structures are unremarkable. IMPRESSION: No active disease. Electronically Signed   By: Ernie AvenaPalani  Rathinasamy M.D.   On: 12/11/2021 12:52   ? ?Procedures ?Procedures  ? ? ?Medications Ordered in ED ?Medications  ?ketorolac (TORADOL) 30 MG/ML injection 30 mg (has no administration in time range)  ?sodium chloride 0.9 % bolus 1,000 mL (1,000 mLs Intravenous New Bag/Given 12/11/21 1533)  ?ondansetron [facility](ZOFRAN) injection 4 mg (4 mg Intravenous Given 12/11/21 1532)  ?LORazepam (ATIVAN) injection 1 mg (1 mg Intravenous Given 12/11/21 1631)  ? ? ?ED Course/ Medical Decision Making/ A&P ?Clinical Course as of 12/11/21 1721  ?Thu Dec 11, 2021  ?1455 EKG 12-Lead [HS]  ?  ?Clinical  Course User Index ?[HS] Theron AristaSage, Tamirra Sienkiewicz, PA-C  ? ?                        ?Medical Decision Making ?Amount and/or Complexity of Data Reviewed ?ECG/medicine tests:  Decision-making details documented in ED Course. ? ?Risk ?Prescription drug management. ? ? ?This patient presents to the ED for concern of emesis and shortness of breath, this involves an extensive number of treatment options, and is a complaint that carries with it a high risk of complications and morbidity.  The differential diagnosis includes but not limited to cannabis hyperemesis, dehydration, gross electrolyte derangement, AKI, pneumonia, gastritis  ? ?Patient?s presentation is complicated by their  history of daily marijuana use ? ? ?Additional history obtained:  ? ?Reviewed internal medicine note, patient has a history of GERD without esophagitis.  Currently on pepcid ? ?  ?Lab Tests: ? ?I ordered, viewed, and personally interpreted labs.  The pertinent results include:   ?CBC with leukocytosis 17.0, likely reactionary ketosis secondary to multiple episodes of emesis. ?CMP without any gross electrolyte derangement or AKI, mild hypoglycemia with a potassium of 3.4.  Slightly hyperglycemic at 165. ?Lipase within normal limits. ?COVID and flu negative. ? ?  ?Imaging Studies ordered: ? ?I directly visualized the DG chest, which showed no acute process ? ?I agree with the radiologist interpretation ?  ? ?ECG/Cardiac monitoring:  ? ?Per my interpretation, EKG shows sinus arrhythmia with RBBB versus early repolarization ? ?The patient was maintained on a cardiac monitor.  Visualized monitor strip which showed NSR, HR 78 per my interpretation.  ? ? ?Medicines ordered and prescription drug management: ? ?I ordered medication including: Zofran, Ativan, fluid bolus ? ?I have reviewed the patients home medicines and have made adjustments as needed ? ? ?Test Considered: ? ?Considered CT abdomen but patient does not have any focal tenderness on exam, his abdomen  soft peritoneal signs.  I suspect leukocytosis is more likely related to reactionary ketosis rather than an acute intra-abdominal process.  Especially in the setting of concurrent cannabinoid usage ? ? ? ?Reeva

## 2021-12-12 ENCOUNTER — Telehealth: Payer: Self-pay

## 2021-12-12 NOTE — Telephone Encounter (Signed)
Transition Care Management Unsuccessful Follow-up Telephone Call ? ?Date of discharge and from where:  12/11/2021-[place]  ? ?Attempts:  1st Attempt ? ?Reason for unsuccessful TCM follow-up call:  Unable to reach patient ? ?  ?

## 2021-12-16 NOTE — Telephone Encounter (Signed)
Transition Care Management Unsuccessful Follow-up Telephone Call ? ?Date of discharge and from where:  12/11/2021-  ? ?Attempts:  2nd Attempt ? ?Reason for unsuccessful TCM follow-up call:  Unable to reach patient ? ?  ?

## 2021-12-17 NOTE — Telephone Encounter (Signed)
Transition Care Management Unsuccessful Follow-up Telephone Call ? ?Date of discharge and from where:  12/11/2021-[place]  ? ?Attempts:  3rd Attempt ? ?Reason for unsuccessful TCM follow-up call:  Unable to reach patient ? ?  ?

## 2023-06-01 IMAGING — CT CT ABD-PELV W/ CM
2 of 4 series · 16 of 46 positions shown, 18 images · IV contrast (omnipaque)
Comparison: None.

CLINICAL DATA: Abdominal pain with nausea and vomiting

EXAM:
CT ABDOMEN AND PELVIS WITH CONTRAST
TECHNIQUE: Multidetector CT imaging of the abdomen and pelvis was performed
using the standard protocol following bolus administration of
intravenous contrast.
CONTRAST:  100mL OMNIPAQUE IOHEXOL 300 MG/ML  SOLN

[Series 2: axial st · axial · 0.64mm/px · z∈[-488,-128]mm · 13 of 82 slices shown, 15 images]
[im 5/82  soft-tissue]
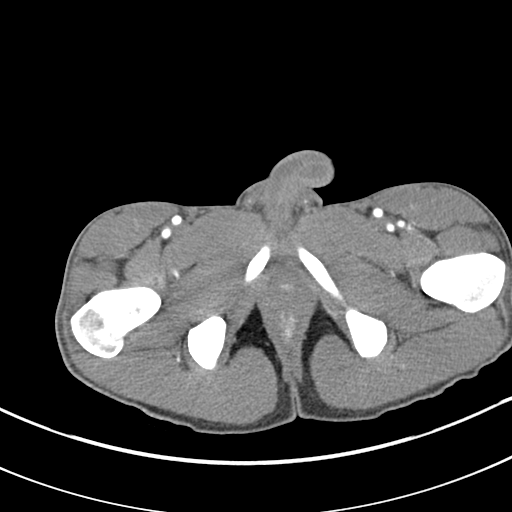
[im 5/82  bone]
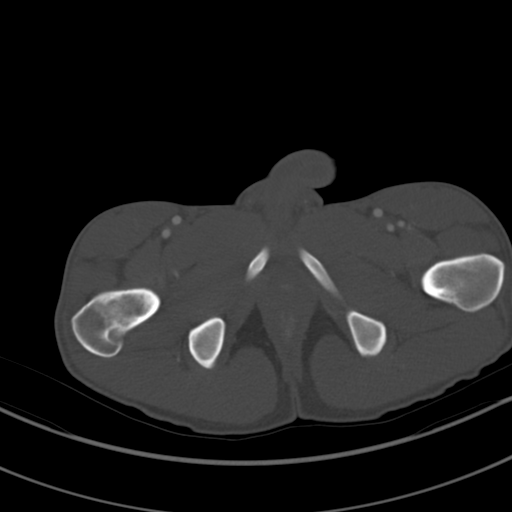
[im 10/82  soft-tissue]
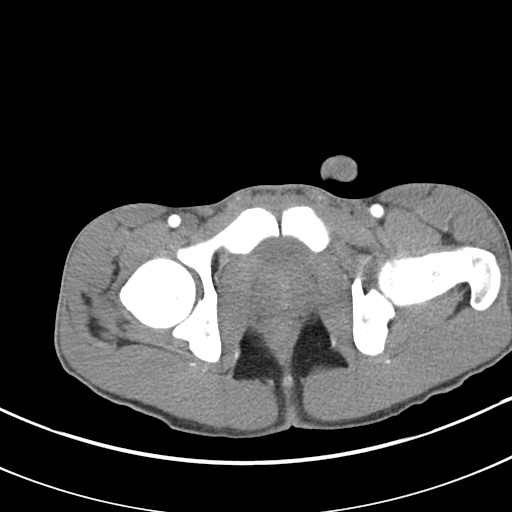
[im 19/82  soft-tissue]
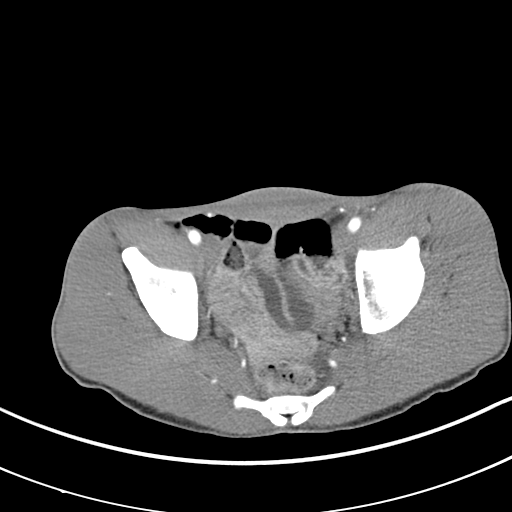
[im 23/82  soft-tissue]
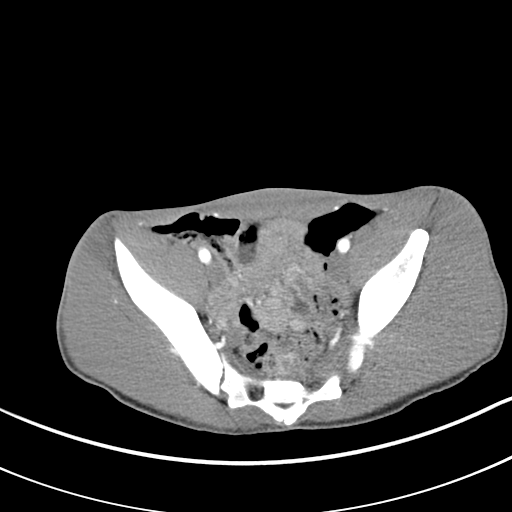
[im 28/82  soft-tissue]
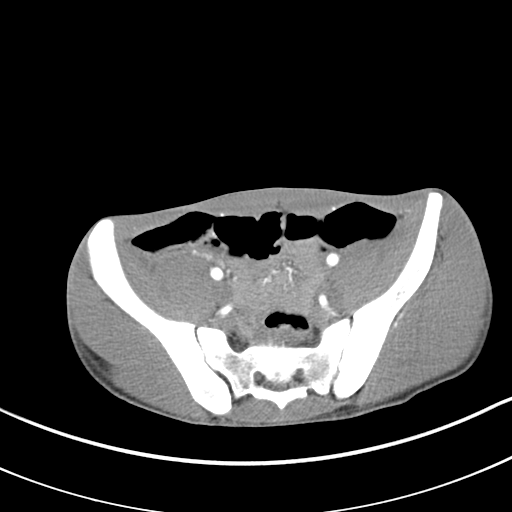
[im 37/82  soft-tissue]
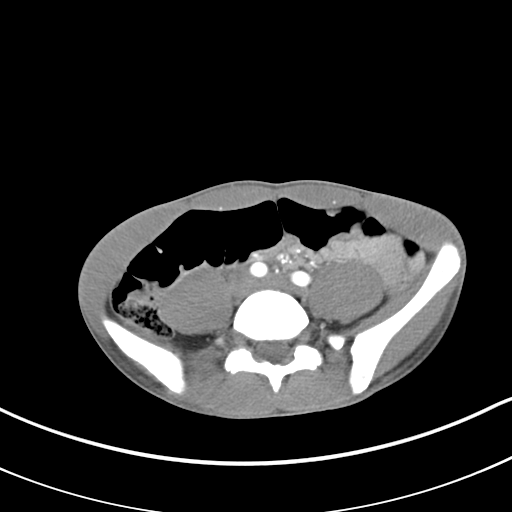
[im 41/82  soft-tissue]
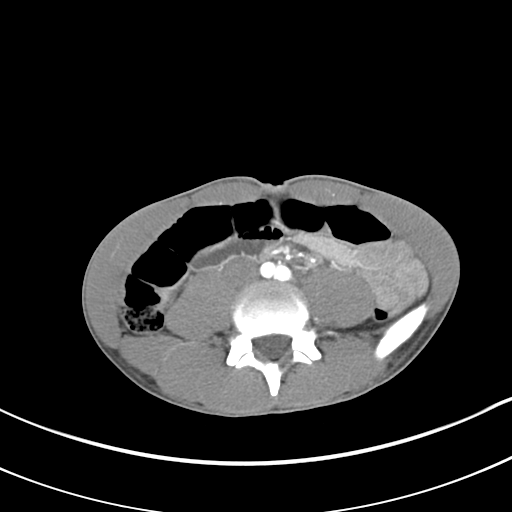
[im 46/82  soft-tissue]
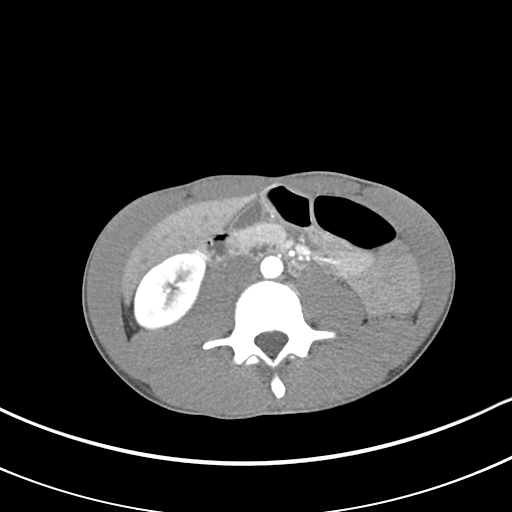
[im 55/82  soft-tissue]
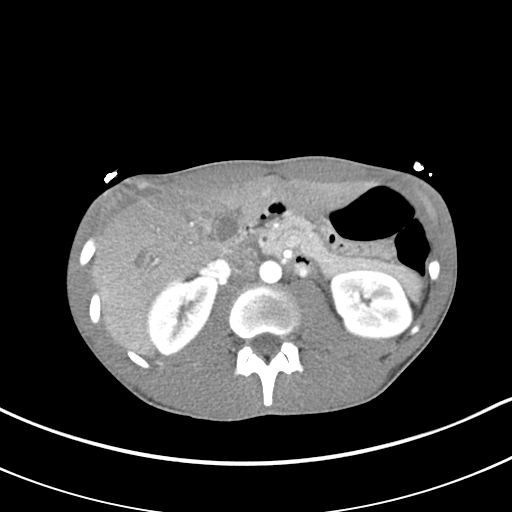
[im 55/82  bone]
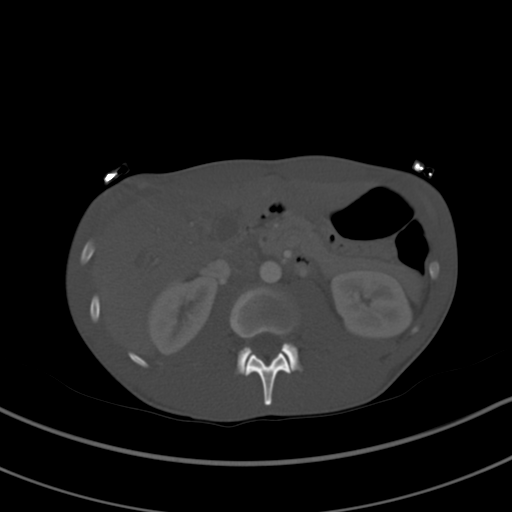
[im 59/82  soft-tissue]
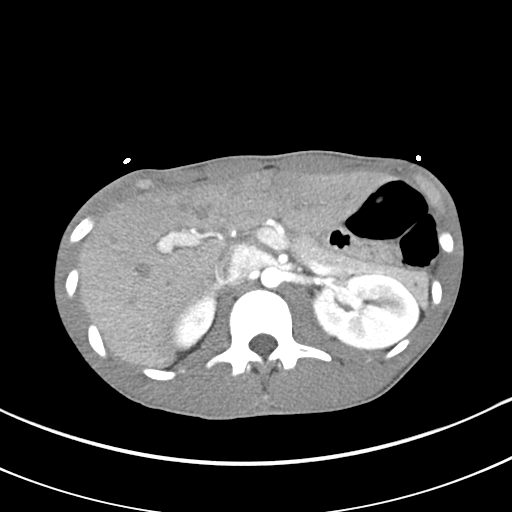
[im 64/82  soft-tissue]
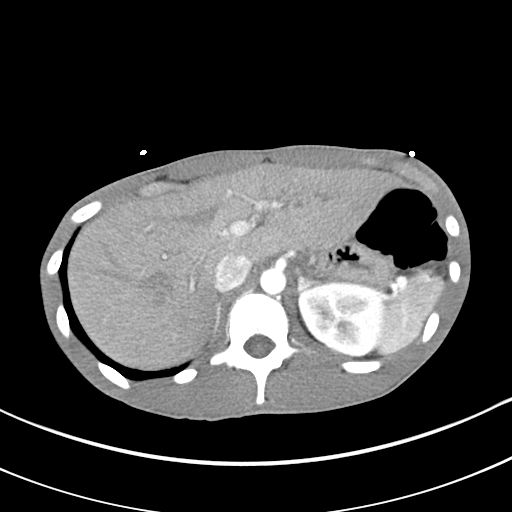
[im 73/82  soft-tissue]
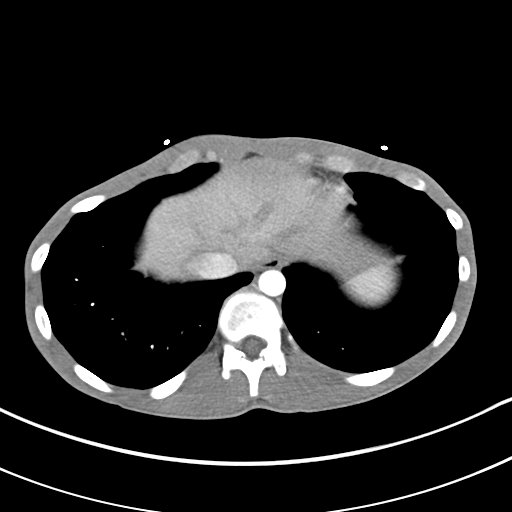
[im 77/82  soft-tissue]
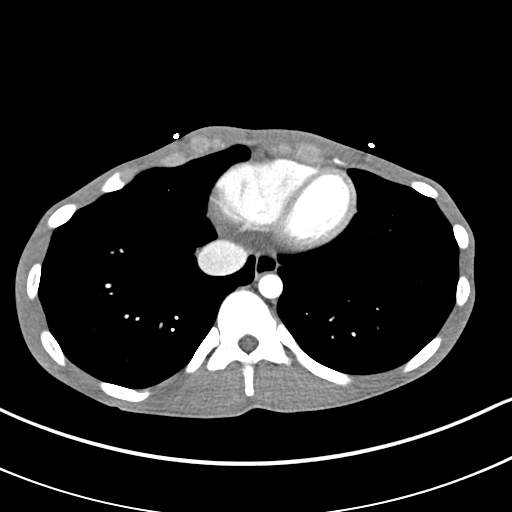

[Series 4: coronal st · coronal · 0.66mm/px · 3 of 95 slices shown]
[im 32/95  soft-tissue]
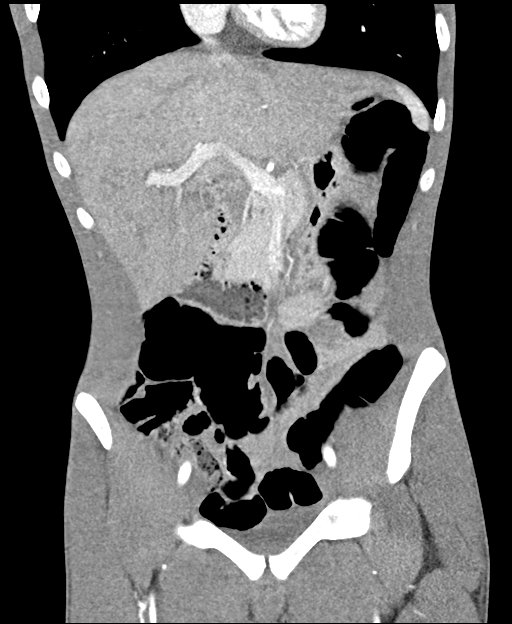
[im 42/95  soft-tissue]
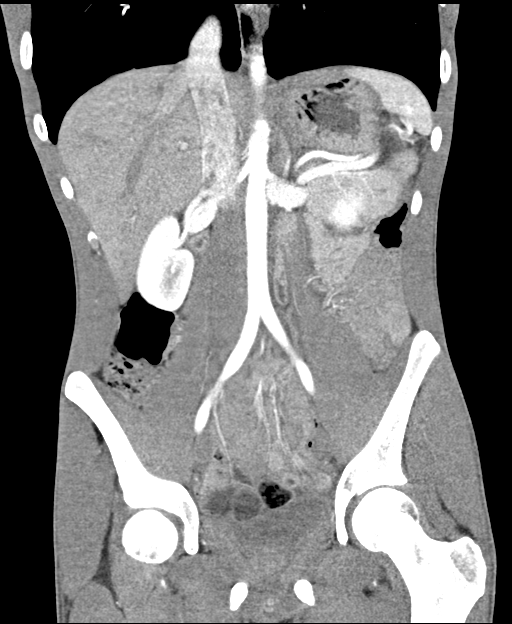
[im 53/95  soft-tissue]
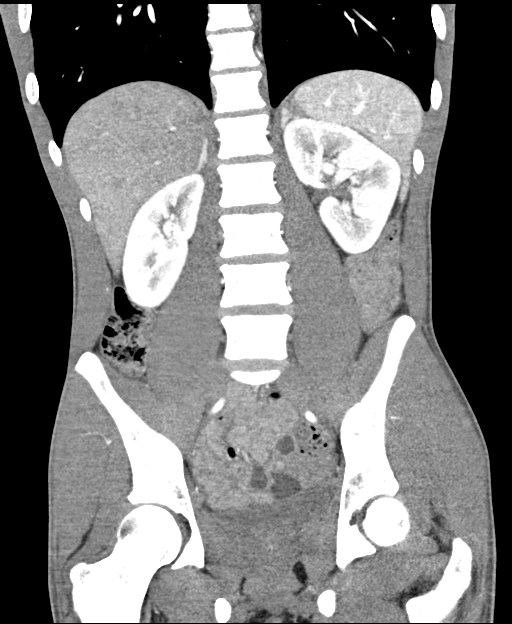

[16 of 46 positions shown; findings below may reference images not displayed]

FINDINGS: Lower chest: Lung bases are clear.

Hepatobiliary: There is hepatic steatosis. No focal liver lesions
are appreciable. There is a a degree of periportal edema. No focal
liver lesions are evident. The gallbladder wall is not appreciably
thickened. There is no biliary duct dilatation.

Pancreas: There is no pancreatic mass or inflammatory focus.

Spleen: No splenic lesions are evident.

Adrenals/Urinary Tract: Adrenals bilaterally appear unremarkable.
There is no appreciable renal mass or hydronephrosis on either side.
No evident renal or ureteral calculus on either side. Urinary
bladder is midline with wall thickness within normal limits.

Stomach/Bowel: No appreciable bowel wall thickening or bowel
obstruction. Terminal ileum appears normal. No periappendiceal
region inflammation evident. No evident free air or portal venous
air.

Vascular/Lymphatic: No arterial vascular lesions are evident. Major
venous structures appear patent. No evident adenopathy in the
abdomen or pelvis.

Reproductive: Prostate and seminal vesicles normal in size and
contour.

Other: No evident abscess or ascites in the abdomen or pelvis.

Musculoskeletal: No blastic or lytic bone lesions. No abdominal wall
or intramuscular lesions are appreciable.
IMPRESSION: 1. Evidence of hepatic steatosis. There is apparent periportal
edema. Etiology uncertain. Advise appropriate laboratory correlation
to assess for potential parenchymal liver disease which could
account for this finding.

2. No bowel wall thickening or bowel obstruction. No appreciable
abscess in the abdomen or pelvis. No periappendiceal region
inflammation evident.

3. No evident renal or ureteral calculus. No hydronephrosis. Urinary
bladder wall thickness within normal limits.

## 2023-09-24 ENCOUNTER — Encounter: Payer: Self-pay | Admitting: Emergency Medicine

## 2023-09-24 ENCOUNTER — Ambulatory Visit
Admission: EM | Admit: 2023-09-24 | Discharge: 2023-09-24 | Disposition: A | Discharge status: Home / Self Care | Payer: Self-pay

## 2023-09-24 DIAGNOSIS — R112 Nausea with vomiting, unspecified: Secondary | ICD-10-CM

## 2023-09-24 DIAGNOSIS — R0789 Other chest pain: Secondary | ICD-10-CM

## 2023-09-24 DIAGNOSIS — R1013 Epigastric pain: Secondary | ICD-10-CM

## 2023-09-24 MED ORDER — ONDANSETRON 4 MG PO TBDP
4.0000 mg | ORAL_TABLET | Freq: Once | ORAL | Status: AC
  Administered 2023-09-24: 4 mg via ORAL

## 2023-09-24 MED ORDER — ONDANSETRON 4 MG PO TBDP: 4.0000 mg | ORAL_TABLET | Freq: Three times a day (TID) | ORAL | 0 refills | Status: DC | PRN

## 2023-09-24 MED ORDER — DICYCLOMINE HCL 20 MG PO TABS: 20.0000 mg | ORAL_TABLET | Freq: Two times a day (BID) | ORAL | 0 refills | Status: DC | PRN

## 2023-09-24 NOTE — ED Triage Notes (Signed)
Pt also states he has had emesis since yesterday morning.

## 2023-09-24 NOTE — ED Triage Notes (Signed)
Pt presents with chest pain and SOB since yesterday (09/23/23) morning. Pt also states he has had "bloatiness and nausea"

## 2023-09-24 NOTE — ED Provider Notes (Signed)
[facility]    CSN: [id number] Arrival date & time: 09/24/23  1537      History   Chief Complaint Chief Complaint  Patient presents with   Shortness of Breath   Chest Pain   Nausea   Emesis    HPI Douglas Mckinney is a 28 y.o. male.   28 year old male presents with vomiting  The history is provided by the patient.    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 No pertinent surgical history.     Home Medications    Prior to Admission medications   Medication Sig Start Date End Date Taking? Authorizing Provider  dicyclomine (BENTYL) 20 MG tablet Take 1 tablet (20 mg total) by mouth every 12 (twelve) hours as needed for spasms (abdominal pain). 09/24/23  Yes Fishel Wamble, Ali Lowe, NP  ondansetron (ZOFRAN-ODT) 4 MG disintegrating tablet Take 1 tablet (4 mg total) by mouth every 8 (eight) hours as needed for nausea or vomiting. 09/24/23  Yes Gypsy Kellogg, Ali Lowe, NP  omeprazole (PRILOSEC) 20 MG capsule Take 1 capsule (20 mg total) by mouth daily. 05/19/19 07/31/19  Georgetta Haber, NP  sucralfate (CARAFATE) 1 GM/10ML suspension Take 10 mLs (1 g total) by mouth 4 (four) times daily -  with meals and at bedtime. 08/17/18 05/19/19  Dowless, Lester Kinsman, PA-C    Family History Family History  Problem Relation Age of Onset   Healthy Mother    Healthy Father     Social History Social History   Tobacco Use   Smoking status: Some Days   Smokeless tobacco: Never  Substance Use Topics   Alcohol use: No   Drug use: Yes    Types: Marijuana     Allergies   Patient has no known allergies.   Review of Systems Review of Systems   Physical Exam Triage Vital Signs ED Triage Vitals  Encounter Vitals Group     BP 09/24/23 1605 (!) 151/78     Systolic BP  Percentile --      Diastolic BP Percentile --      Pulse Rate 09/24/23 1605 83     Resp 09/24/23 1605 20     Temp 09/24/23 1605 98.6 F (37 C)     Temp Source 09/24/23 1605 Oral     SpO2 09/24/23 1605 99 %     Weight 09/24/23 1603 119 lb 14.9 oz (54.4 kg)     Height 09/24/23 1603 5\' 7"  (1.702 m)     Head Circumference --      Peak Flow --      Pain Score 09/24/23 1602 0     Pain Loc --      Pain Education --      Exclude from Growth Chart --    No data found.  Updated Vital Signs BP (!) 151/78 (BP Location: Left Arm)   Pulse 83   Temp 98.6 F (37 C) (Oral)   Resp 20   Ht 5\' 7"  (1.702 m)   Wt 119 lb 14.9 oz (54.4 kg)   SpO2 99%   BMI 18.78 kg/m   Visual Acuity Right Eye Distance:   Left Eye Distance:   Bilateral Distance:    Right Eye Near:   Left Eye Near:  Bilateral Near:     Physical Exam   UC Treatments / Results  Labs (all labs ordered are listed, but only abnormal results are displayed) Labs Reviewed - No data to display  EKG rate = 69 Unable to bring up tracing of previous ECG from 11/2021 due to error in Epic.  Computer Interpretation is General sinus rhythm with occasional PVC but difficult to interpret due to patient movement. Attempted multiple times to obtain a good tracing with minimal success. Patient is very anxious and continues to "shudder" randomly during exam along with recent dry-heaving.   Radiology No results found.  Procedures Procedures (including critical care time)  Medications Ordered in UC Medications  ondansetron (ZOFRAN-ODT) disintegrating tablet 4 mg (4 mg Oral Given 09/24/23 1621)    Initial Impression / Assessment and Plan / UC Course  I have reviewed the triage vital signs and the nursing notes.  Pertinent labs & imaging results that were available during my care of the patient were reviewed by me and considered in my medical decision making (see chart for details).     *** Final Clinical Impressions(s) / UC  Diagnoses   Final diagnoses:  Nausea and vomiting, unspecified vomiting type  Other chest pain  Abdominal pain, epigastric     Discharge Instructions      We gave you Zofran 4mg  now to help with vomiting. You can continue Zofran 4mg  dissolving tablet every 8 hours as needed for nausea or vomiting. You may also take Bentyl 20mg  every 12 hours as needed for stomach pain/cramping. The chest pain appears to be pain radiating from your stomach and causing you to feel anxious and short of breath. If the pain does not improve or you are unable to keep down any fluids in 24 hours, go to the ER ASAP for further evaluation.     ED Prescriptions     Medication Sig Dispense Auth. Provider   ondansetron (ZOFRAN-ODT) 4 MG disintegrating tablet Take 1 tablet (4 mg total) by mouth every 8 (eight) hours as needed for nausea or vomiting. 12 tablet Azalya Galyon, Ali Lowe, NP   dicyclomine (BENTYL) 20 MG tablet Take 1 tablet (20 mg total) by mouth every 12 (twelve) hours as needed for spasms (abdominal pain). 20 tablet Sanari Offner, Ali Lowe, NP      PDMP not reviewed this encounter.

## 2023-09-24 NOTE — Discharge Instructions (Signed)
We gave you Zofran 4mg  now to help with vomiting. You can continue Zofran 4mg  dissolving tablet every 8 hours as needed for nausea or vomiting. You may also take Bentyl 20mg  every 12 hours as needed for stomach pain/cramping. The chest pain appears to be pain radiating from your stomach and causing you to feel anxious and short of breath. If the pain does not improve or you are unable to keep down any fluids in 24 hours, go to the ER ASAP for further evaluation.

## 2024-10-06 ENCOUNTER — Ambulatory Visit: Payer: Self-pay

## 2024-10-06 VITALS — BP 122/73 | HR 94 | Temp 98.1°F | Resp 16 | Ht 68.0 in | Wt 123.2 lb

## 2024-10-06 DIAGNOSIS — Z7689 Persons encountering health services in other specified circumstances: Secondary | ICD-10-CM

## 2024-10-06 DIAGNOSIS — Z13220 Encounter for screening for lipoid disorders: Secondary | ICD-10-CM

## 2024-10-06 DIAGNOSIS — Z13228 Encounter for screening for other metabolic disorders: Secondary | ICD-10-CM

## 2024-10-06 DIAGNOSIS — Z130 Encounter for screening for diseases of the blood and blood-forming organs and certain disorders involving the immune mechanism: Secondary | ICD-10-CM

## 2024-10-06 DIAGNOSIS — F32A Depression, unspecified: Secondary | ICD-10-CM

## 2024-10-06 NOTE — Progress Notes (Unsigned)
" ° ° ° °  Patient ID: Douglas Mckinney, male    DOB: 1996-02-22  MRN: [id number]  CC: Establish Care   Subjective: Douglas Mckinney is a 29 y.o. 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   Allergies[1]  ROS: Review of Systems Negative except as stated above  PHYSICAL EXAM: BP 122/73   Pulse 94   Temp 98.1 F (36.7 C) (Oral)   Resp 16   Ht 5' 8 (1.727 m)   Wt 123 lb 3.2 oz (55.9 kg)   SpO2 97%   BMI 18.73 kg/m   Physical Exam  General: well-appearing, no acute distress Skin: no jaundice, rashes, or lesions Cardiovascular: regular heart rate and rhythm, normal S1/S2, no murmurs, gallops, or rubs, peripheral pulses 2+ bilaterally Chest: no skeletal deformity, lungs clear to auscultation bilaterally, equal breath sounds bilaterally Abdomen: soft, non-distended, non-tender to palpation, no hepatomegaly, no splenomegaly, normoactive bowel sounds Musculoskeletal: normal gait Extremities: no peripheral edema  ASSESSMENT AND PLAN:  There are no diagnoses linked to this encounter.   Patient was given the opportunity to ask questions.  Patient verbalized understanding of the plan and was able to repeat key elements of the plan.    No orders of the defined types were placed in this encounter.    Requested Prescriptions    No prescriptions requested or ordered in this encounter    No follow-ups on file.  Sula Cower Loraine Freid, PA-C     [1] No Known Allergies  "
